# Patient Record
Sex: Male | Born: 1970 | Hispanic: Yes | Marital: Married | Smoking: Current every day smoker
Health system: Southern US, Community
[De-identification: ages and names within clinical notes are randomized; demographics above are authoritative.]

---

## 2008-12-05 ENCOUNTER — Emergency Department (HOSPITAL_COMMUNITY): Admission: EM | Admit: 2008-12-05 | Discharge: 2008-12-05 | Payer: Self-pay | Admitting: Family Medicine

## 2015-03-22 ENCOUNTER — Encounter (HOSPITAL_COMMUNITY): Payer: Self-pay | Admitting: Emergency Medicine

## 2015-03-22 ENCOUNTER — Emergency Department (HOSPITAL_COMMUNITY)
Admission: EM | Admit: 2015-03-22 | Discharge: 2015-03-23 | Disposition: A | Discharge status: Home / Self Care | Payer: BLUE CROSS/BLUE SHIELD | Attending: Emergency Medicine | Admitting: Emergency Medicine

## 2015-03-22 DIAGNOSIS — K6389 Other specified diseases of intestine: Secondary | ICD-10-CM | POA: Diagnosis not present

## 2015-03-22 DIAGNOSIS — R1032 Left lower quadrant pain: Secondary | ICD-10-CM | POA: Diagnosis present

## 2015-03-22 DIAGNOSIS — F17200 Nicotine dependence, unspecified, uncomplicated: Secondary | ICD-10-CM | POA: Insufficient documentation

## 2015-03-22 DIAGNOSIS — N50819 Testicular pain, unspecified: Secondary | ICD-10-CM | POA: Diagnosis not present

## 2015-03-22 DIAGNOSIS — K529 Noninfective gastroenteritis and colitis, unspecified: Secondary | ICD-10-CM

## 2015-03-22 LAB — COMPREHENSIVE METABOLIC PANEL
ALT: 30 U/L (ref 17–63)
ANION GAP: 10 (ref 5–15)
AST: 24 U/L (ref 15–41)
Albumin: 4 g/dL (ref 3.5–5.0)
Alkaline Phosphatase: 67 U/L (ref 38–126)
BILIRUBIN TOTAL: 0.5 mg/dL (ref 0.3–1.2)
BUN: 9 mg/dL (ref 6–20)
CO2: 24 mmol/L (ref 22–32)
Calcium: 9.4 mg/dL (ref 8.9–10.3)
Chloride: 104 mmol/L (ref 101–111)
Creatinine, Ser: 0.96 mg/dL (ref 0.61–1.24)
Glucose, Bld: 140 mg/dL — ABNORMAL HIGH (ref 65–99)
POTASSIUM: 3.6 mmol/L (ref 3.5–5.1)
Sodium: 138 mmol/L (ref 135–145)
TOTAL PROTEIN: 7 g/dL (ref 6.5–8.1)

## 2015-03-22 LAB — URINALYSIS, ROUTINE W REFLEX MICROSCOPIC
BILIRUBIN URINE: NEGATIVE
Glucose, UA: NEGATIVE mg/dL
HGB URINE DIPSTICK: NEGATIVE
KETONES UR: NEGATIVE mg/dL
Leukocytes, UA: NEGATIVE
NITRITE: NEGATIVE
PROTEIN: NEGATIVE mg/dL
SPECIFIC GRAVITY, URINE: 1.012 (ref 1.005–1.030)
pH: 5.5 (ref 5.0–8.0)

## 2015-03-22 LAB — CBC
HCT: 45.1 % (ref 39.0–52.0)
HEMOGLOBIN: 15.8 g/dL (ref 13.0–17.0)
MCH: 31.2 pg (ref 26.0–34.0)
MCHC: 35 g/dL (ref 30.0–36.0)
MCV: 89 fL (ref 78.0–100.0)
PLATELETS: 226 10*3/uL (ref 150–400)
RBC: 5.07 MIL/uL (ref 4.22–5.81)
RDW: 12.4 % (ref 11.5–15.5)
WBC: 12.3 10*3/uL — AB (ref 4.0–10.5)

## 2015-03-22 LAB — LIPASE, BLOOD: Lipase: 44 U/L (ref 11–51)

## 2015-03-22 NOTE — ED Notes (Signed)
Pt. reports LLQ pain onset 2 days ago , denies nausea or vomitting , no fever or diarrhea.

## 2015-03-23 ENCOUNTER — Encounter (HOSPITAL_COMMUNITY): Payer: Self-pay

## 2015-03-23 ENCOUNTER — Emergency Department (HOSPITAL_COMMUNITY): Payer: BLUE CROSS/BLUE SHIELD

## 2015-03-23 LAB — DIFFERENTIAL
Basophils Absolute: 0 K/uL (ref 0.0–0.1)
Basophils Relative: 0 %
Eosinophils Absolute: 0.3 K/uL (ref 0.0–0.7)
Eosinophils Relative: 3 %
Lymphocytes Relative: 26 %
Lymphs Abs: 3.2 K/uL (ref 0.7–4.0)
Monocytes Absolute: 1.2 K/uL — ABNORMAL HIGH (ref 0.1–1.0)
Monocytes Relative: 10 %
Neutro Abs: 7.4 K/uL (ref 1.7–7.7)
Neutrophils Relative %: 61 %

## 2015-03-23 MED ORDER — ONDANSETRON 4 MG PO TBDP
4.0000 mg | ORAL_TABLET | Freq: Three times a day (TID) | ORAL | Status: DC | PRN
Start: 2015-03-23 — End: 2015-11-22

## 2015-03-23 MED ORDER — SODIUM CHLORIDE 0.9 % IV BOLUS (SEPSIS)
1000.0000 mL | Freq: Once | INTRAVENOUS | Status: AC
  Administered 2015-03-23: 1000 mL via INTRAVENOUS

## 2015-03-23 MED ORDER — HYDROCODONE-ACETAMINOPHEN 5-325 MG PO TABS: 1.0000 | ORAL_TABLET | ORAL | Status: DC | PRN

## 2015-03-23 MED ORDER — KETOROLAC TROMETHAMINE 30 MG/ML IJ SOLN
30.0000 mg | Freq: Once | INTRAMUSCULAR | Status: AC
  Administered 2015-03-23: 30 mg via INTRAVENOUS
  Filled 2015-03-23: qty 1

## 2015-03-23 MED ORDER — ONDANSETRON HCL 4 MG/2ML IJ SOLN
4.0000 mg | Freq: Once | INTRAMUSCULAR | Status: AC
  Administered 2015-03-23: 4 mg via INTRAVENOUS
  Filled 2015-03-23: qty 2

## 2015-03-23 MED ORDER — MORPHINE SULFATE (PF) 4 MG/ML IV SOLN
4.0000 mg | Freq: Once | INTRAVENOUS | Status: AC
  Administered 2015-03-23: 4 mg via INTRAVENOUS
  Filled 2015-03-23: qty 1

## 2015-03-23 MED ORDER — IOHEXOL 300 MG/ML  SOLN
100.0000 mL | Freq: Once | INTRAMUSCULAR | Status: AC | PRN
  Administered 2015-03-23: 100 mL via INTRAVENOUS

## 2015-03-23 MED ORDER — IBUPROFEN 800 MG PO TABS: 800.0000 mg | ORAL_TABLET | Freq: Three times a day (TID) | ORAL | Status: DC | PRN

## 2015-03-23 NOTE — ED Provider Notes (Signed)
By signing my name below, I, Doreatha Martin, attest that this documentation has been prepared under the direction and in the presence of Kristen N Ward, DO. Electronically Signed: Doreatha Martin, ED Scribe. 03/23/2015. 12:55 AM.   TIME SEEN: 12:48 AM   CHIEF COMPLAINT:  Chief Complaint  Patient presents with  . Abdominal Pain     HPI:  HPI Comments: Brandon Salas is a 44 y.o. male with no significant past medical history who presents to the Emergency Department complaining of moderate sharp suprapubic and LLQ abdominal pain onset 2 days ago with associated diarrhea (3-4 episodes today). He notes that his pain also radiates to the testicles. Pt states no worsening or alleviating factors of his pain. No h/o similar symptoms, abdominal surgery, renal calculi, diverticulitis, colonoscopy. No recent travel outside the country or sick contacts. Pt is not currently followed by a PCP. NKDA. He denies fever, chills, nausea, vomiting, hematochezia, melena, dysuria, hematuria, penile discharge, testicular swelling. States he has had several episodes of diarrhea.  ROS: See HPI Constitutional: no fever, chills Eyes: no drainage  ENT: no runny nose   Cardiovascular:  no chest pain  Resp: no SOB  GI: no vomiting, nausea, hematochezia, melena. Positive for abdominal pain, diarrhea.  GU: no dysuria, hematuria, penile discharge, testicular swelling. Positive for testicular pain Integumentary: no rash  Allergy: no hives  Musculoskeletal: no leg swelling  Neurological: no slurred speech ROS otherwise negative  PAST MEDICAL HISTORY/PAST SURGICAL HISTORY:  History reviewed. No pertinent past medical history.  MEDICATIONS:  Prior to Admission medications   Not on File    ALLERGIES:  No Known Allergies  SOCIAL HISTORY:  Social History  Substance Use Topics  . Smoking status: Current Every Day Smoker  . Smokeless tobacco: Not on file  . Alcohol Use: Yes    FAMILY HISTORY: No family history  on file.  EXAM: BP 103/66 mmHg  Pulse 109  Temp(Src) 97.9 F (36.6 C) (Oral)  Resp 18  SpO2 96% CONSTITUTIONAL: Alert and oriented and responds appropriately to questions. Well-appearing; well-nourished, afebrile, nontoxic HEAD: Normocephalic EYES: Conjunctivae clear, PERRL ENT: normal nose; no rhinorrhea; moist mucous membranes; pharynx without lesions noted NECK: Supple, no meningismus, no LAD  CARD: RRR; S1 and S2 appreciated; no murmurs, no clicks, no rubs, no gallops RESP: Normal chest excursion without splinting or tachypnea; breath sounds clear and equal bilaterally; no wheezes, no rhonchi, no rales, no hypoxia or respiratory distress, speaking full sentences ABD/GI: Tender in suprapubic region and LLQ region with voluntary guarding. Normal bowel sounds; non-distended; soft, no rebound, no peritoneal signs. No tenderness at McBurney's point, negative Murphy sign. GU: Chaperone present throughout entire exam. GU:  Normal external genitalia, circumcised male, normal penile shaft, no blood or discharge at the urethral meatus, no testicular masses or tenderness on exam, no scrotal masses or swelling, no hernias appreciated, 2+ femoral pulses bilaterally; no perineal erythema, warmth, subcutaneous air or crepitus; no high riding testicle, normal bilateral cremasteric reflex BACK:  The back appears normal and is non-tender to palpation, there is no CVA tenderness EXT: Normal ROM in all joints; non-tender to palpation; no edema; normal capillary refill; no cyanosis, no calf tenderness or swelling    SKIN: Normal color for age and race; warm NEURO: Moves all extremities equally, sensation to light touch intact diffusely, cranial nerves II through XII intact PSYCH: The patient's mood and manner are appropriate. Grooming and personal hygiene are appropriate.  MEDICAL DECISION MAKING: Patient here with left lower quadrant pain. Differential  diagnosis includes diverticulitis, colitis, bowel  obstruction. Less likely kidney stone or palate nephritis given normal urinalysis. Labs ordered in triage show mild leukocytosis without left shift. LFTs, creatinine and lipase unremarkable. No tenderness in the right lower quadrant at McBurney's point. Negative Murphy sign. Normal genital exam. Will give IV fluids, pain and nausea medicine. We'll obtain a CT of his abdomen and pelvis for further evaluation.  ED PROGRESS: Patient reports his pain is improved. CT scan is consistent with epiploic appendage I does. No diverticulitis or appendicitis seen. No abscess. No bowel obstruction or free air. He does have diverticuli. Have recommended anti-inflammatories. Will also discharge with prescription for Vicodin to take as needed for pain. Have provided him with outpatient primary care follow-up. Have discussed return precautions. I do not feel he needs antibiotics. Patient and significant other at bedside verbalize understanding and are comfortable with this plan.   I personally performed the services described in this documentation, which was scribed in my presence. The recorded information has been reviewed and is accurate.   Layla MawKristen N Ward, DO 03/23/15 [phone]

## 2015-03-23 NOTE — Discharge Instructions (Signed)
You were seen in the emergency department for abdominal pain and diarrhea. You were found to have epiploic appendagitis which is inflammation of a small outpouching of fat along your intestine. This treated with anti-inflammatories. We recommend that if you have high fever, worsening pain, vomiting and cannot tolerate liquids that you return to the emergency department. This is not a condition that normally needs surgery or antibiotics. It normally resolves between 3-14 days.

## 2015-05-10 ENCOUNTER — Ambulatory Visit (INDEPENDENT_AMBULATORY_CARE_PROVIDER_SITE_OTHER): Payer: BLUE CROSS/BLUE SHIELD | Admitting: Family Medicine

## 2015-05-10 ENCOUNTER — Encounter: Payer: Self-pay | Admitting: Family Medicine

## 2015-05-10 DIAGNOSIS — R1032 Left lower quadrant pain: Secondary | ICD-10-CM

## 2015-05-10 DIAGNOSIS — R109 Unspecified abdominal pain: Secondary | ICD-10-CM | POA: Insufficient documentation

## 2015-05-10 DIAGNOSIS — Z23 Encounter for immunization: Secondary | ICD-10-CM | POA: Diagnosis not present

## 2015-05-10 DIAGNOSIS — Z0000 Encounter for general adult medical examination without abnormal findings: Secondary | ICD-10-CM | POA: Diagnosis not present

## 2015-05-10 LAB — CBC WITH DIFFERENTIAL/PLATELET
BASOS ABS: 0.1 10*3/uL (ref 0.0–0.1)
Basophils Relative: 1 % (ref 0–1)
EOS ABS: 0.3 10*3/uL (ref 0.0–0.7)
EOS PCT: 4 % (ref 0–5)
HEMATOCRIT: 48 % (ref 39.0–52.0)
Hemoglobin: 16.4 g/dL (ref 13.0–17.0)
LYMPHS ABS: 2.6 10*3/uL (ref 0.7–4.0)
LYMPHS PCT: 34 % (ref 12–46)
MCH: 30.1 pg (ref 26.0–34.0)
MCHC: 34.2 g/dL (ref 30.0–36.0)
MCV: 88.2 fL (ref 78.0–100.0)
MONO ABS: 0.8 10*3/uL (ref 0.1–1.0)
MONOS PCT: 10 % (ref 3–12)
MPV: 11.7 fL (ref 8.6–12.4)
Neutro Abs: 3.9 10*3/uL (ref 1.7–7.7)
Neutrophils Relative %: 51 % (ref 43–77)
PLATELETS: 210 10*3/uL (ref 150–400)
RBC: 5.44 MIL/uL (ref 4.22–5.81)
RDW: 12.7 % (ref 11.5–15.5)
WBC: 7.7 10*3/uL (ref 4.0–10.5)

## 2015-05-10 LAB — LIPID PANEL
Cholesterol: 237 mg/dL — ABNORMAL HIGH (ref 125–200)
HDL: 35 mg/dL — ABNORMAL LOW (ref >=40)
LDL CALC: 127 mg/dL (ref <130)
Total CHOL/HDL Ratio: 6.8 Ratio — ABNORMAL HIGH (ref <=5.0)
Triglycerides: 373 mg/dL — ABNORMAL HIGH (ref <150)
VLDL: 75 mg/dL — ABNORMAL HIGH (ref <30)

## 2015-05-10 NOTE — Progress Notes (Signed)
Patient ID: Brandon Salas, male   DOB: April 09, 1970, 45 y.o.   MRN: [id number]   Brandon Salas, is a 45 y.o. male  [id number]  [id number]  DOB - 09-16-1970  CC:  Chief Complaint  Patient presents with  . Establish Care       HPI: Brandon Salas is a 45 y.o. male here to establish care. He was recently seen in ED for LLQ pain. No specific reason for the pain was determined. He reports that he continues to have intermittent LLQ pain. I is aggravated by eating heavy meals. He reports with the help of an interpreter that is may be related to [facility] but he is unsure. He reports as being gassy pain. He denies other specific health problems. He is on no regular medications. He is in need of a flu shot and a tetanus shot and will receive those today. He reports eating a regular diet and does not exercise regularly.   No Known Allergies No past medical history on file. Current Outpatient Prescriptions on File Prior to Visit  Medication Sig Dispense Refill  . HYDROcodone-acetaminophen (NORCO/VICODIN) 5-325 MG tablet Take 1 tablet by mouth every 4 (four) hours as needed. (Patient not taking: Reported on 05/10/2015) 15 tablet 0  . ibuprofen (ADVIL,MOTRIN) 800 MG tablet Take 1 tablet (800 mg total) by mouth every 8 (eight) hours as needed for mild pain. (Patient not taking: Reported on 05/10/2015) 30 tablet 0  . ondansetron (ZOFRAN ODT) 4 MG disintegrating tablet Take 1 tablet (4 mg total) by mouth every 8 (eight) hours as needed for nausea or vomiting. (Patient not taking: Reported on 05/10/2015) 20 tablet 0   No current facility-administered medications on file prior to visit.   No family history on file. Social History   Social History  . Marital Status: Single    Spouse Name: N/A  . Number of Children: N/A  . Years of Education: N/A   Occupational History  . Not on file.   Social History Main Topics  . Smoking status: Current Every Day Smoker  . Smokeless tobacco:  Not on file  . Alcohol Use: No  . Drug Use: No  . Sexual Activity: Not on file   Other Topics Concern  . Not on file   Social History Narrative    Review of Systems: Constitutional: Negative for fever, chills, appetite change, weight loss,  Fatigue. Skin: Negative for rashes or lesions of concern. Reports intermittent rash on arms HENT: Negative for ear pain, ear discharge.nose bleeds Eyes: Negative for pain, discharge, redness, itching and visual disturbance. Neck: Negative for pain, stiffness Respiratory: Negative for cough, shortness of breath,   Cardiovascular: Negative for chest pain, palpitations and leg swelling. Gastrointestinal:Positive  for abdominal pain. Negative for nausea, vomiting, diarrhea. Positive for constipations Genitourinary: Negative for dysuria, urgency, frequency, hematuria,  Musculoskeletal: Positive for low back pain and foot pain. Otherwise negative joint pain, joint  swelling, and gait problem.Negative for weakness. Neurological: Negative for tremors, seizures, syncope,   light-headedness, numbness and headaches. Positive for occassional dizziness. Hematological: Negative for easy bruising or bleeding Psychiatric/Behavioral: Negative for depression, anxiety, decreased concentration, confusion   Objective:   Filed Vitals:   05/10/15 0913  BP: 128/89  Pulse: 83  Temp: 98.4 F (36.9 C)  Resp: 20    Physical Exam: Constitutional: Patient appears well-developed and well-nourished. No distress. HENT: Normocephalic, atraumatic, External right and left ear normal. Oropharynx is clear and moist.  Eyes: Conjunctivae and EOM are normal. PERRLA, no scleral icterus.  Neck: Normal ROM. Neck supple. No lymphadenopathy, No thyromegaly. CVS: RRR, S1/S2 +, no murmurs, no gallops, no rubs Pulmonary: Effort and breath sounds normal, no stridor, rhonchi, wheezes, rales.  Abdominal: Soft. Normoactive BS,, no distension, rebound or guarding. Mild tenderness in  LLQ Musculoskeletal: Normal range of motion. No edema and no tenderness.  Neuro: Alert.Normal muscle tone coordination. Non-focal Skin: Skin is warm and dry. No rash noted. Not diaphoretic. No erythema. No pallor. Psychiatric: Normal mood and affect. Behavior, judgment, thought content normal.  Lab Results  Component Value Date   WBC 12.3* 03/22/2015   HGB 15.8 03/22/2015   HCT 45.1 03/22/2015   MCV 89.0 03/22/2015   PLT 226 03/22/2015   Lab Results  Component Value Date   CREATININE 0.96 03/22/2015   BUN 9 03/22/2015   NA 138 03/22/2015   K 3.6 03/22/2015   CL 104 03/22/2015   CO2 24 03/22/2015    No results found for: HGBA1C Lipid Panel  No results found for: CHOL, TRIG, HDL, CHOLHDL, VLDL, LDLCALC     Assessment and plan:   1. Need for prophylactic vaccination and inoculation against influenza  - Flu Vaccine QUAD 36+ mos PF IM (Fluarix & Fluzone Quad PF)  2. Need for prophylactic vaccination with combined diphtheria-tetanus-pertussis (DTP) vaccine  - Tdap vaccine greater than or equal to 7yo IM  3. Abdominal pain, left lower quadrant  - CBC w/Diff -Have recommended he keep a pain, food and BM diary to see if he can determine causitive factor and modify.  4. Health care maintenance  - Lipid panel   Return in about 6 months (around 11/07/2015).  The patient was given clear instructions to go to ER or return to medical center if symptoms don't improve, worsen or new problems develop. The patient verbalized understanding.    Henrietta Hoover FNP  05/10/2015, 10:43 AM

## 2015-05-10 NOTE — Patient Instructions (Signed)
Keep a diary of your abdominal pain, when you eat, what you eat and your bowel movements.  Eat smaller meals.

## 2015-11-08 ENCOUNTER — Ambulatory Visit: Payer: BLUE CROSS/BLUE SHIELD | Admitting: Family Medicine

## 2015-11-22 ENCOUNTER — Ambulatory Visit (INDEPENDENT_AMBULATORY_CARE_PROVIDER_SITE_OTHER): Payer: BLUE CROSS/BLUE SHIELD | Admitting: Family Medicine

## 2015-11-22 ENCOUNTER — Encounter: Payer: Self-pay | Admitting: Family Medicine

## 2015-11-22 VITALS — BP 113/85 | HR 83 | Temp 98.2°F | Resp 16 | Ht 62.0 in | Wt 168.0 lb

## 2015-11-22 DIAGNOSIS — Z114 Encounter for screening for human immunodeficiency virus [HIV]: Secondary | ICD-10-CM

## 2015-11-22 DIAGNOSIS — Z13220 Encounter for screening for lipoid disorders: Secondary | ICD-10-CM

## 2015-11-22 DIAGNOSIS — Z131 Encounter for screening for diabetes mellitus: Secondary | ICD-10-CM

## 2015-11-22 DIAGNOSIS — Z0000 Encounter for general adult medical examination without abnormal findings: Secondary | ICD-10-CM

## 2015-11-22 DIAGNOSIS — R109 Unspecified abdominal pain: Secondary | ICD-10-CM

## 2015-11-22 DIAGNOSIS — R1032 Left lower quadrant pain: Secondary | ICD-10-CM

## 2015-11-22 LAB — LIPID PANEL
CHOLESTEROL: 209 mg/dL — AB (ref 125–200)
HDL: 33 mg/dL — ABNORMAL LOW (ref >=40)
LDL CALC: 119 mg/dL (ref <130)
TRIGLYCERIDES: 285 mg/dL — AB (ref <150)
Total CHOL/HDL Ratio: 6.3 Ratio — ABNORMAL HIGH (ref <=5.0)
VLDL: 57 mg/dL — AB (ref <30)

## 2015-11-22 LAB — CBC WITH DIFFERENTIAL/PLATELET
BASOS ABS: 82 {cells}/uL (ref 0–200)
Basophils Relative: 1 %
EOS PCT: 3 %
Eosinophils Absolute: 246 cells/uL (ref 15–500)
HCT: 47.8 % (ref 38.5–50.0)
Hemoglobin: 16.4 g/dL (ref 13.2–17.1)
LYMPHS PCT: 37 %
Lymphs Abs: 3034 cells/uL (ref 850–3900)
MCH: 30.3 pg (ref 27.0–33.0)
MCHC: 34.3 g/dL (ref 32.0–36.0)
MCV: 88.4 fL (ref 80.0–100.0)
MPV: 11.9 fL (ref 7.5–12.5)
Monocytes Absolute: 738 cells/uL (ref 200–950)
Monocytes Relative: 9 %
NEUTROS PCT: 50 %
Neutro Abs: 4100 cells/uL (ref 1500–7800)
Platelets: 198 10*3/uL (ref 140–400)
RBC: 5.41 MIL/uL (ref 4.20–5.80)
RDW: 13.5 % (ref 11.0–15.0)
WBC: 8.2 10*3/uL (ref 3.8–10.8)

## 2015-11-22 LAB — COMPLETE METABOLIC PANEL WITH GFR
ALBUMIN: 4.3 g/dL (ref 3.6–5.1)
ALK PHOS: 59 U/L (ref 40–115)
ALT: 25 U/L (ref 9–46)
AST: 17 U/L (ref 10–40)
BILIRUBIN TOTAL: 0.3 mg/dL (ref 0.2–1.2)
BUN: 8 mg/dL (ref 7–25)
CALCIUM: 9.5 mg/dL (ref 8.6–10.3)
CO2: 20 mmol/L (ref 20–31)
CREATININE: 0.87 mg/dL (ref 0.60–1.35)
Chloride: 106 mmol/L (ref 98–110)
GFR, Est African American: >89 mL/min (ref >=60)
GFR, Est Non African American: >89 mL/min (ref >=60)
GLUCOSE: 86 mg/dL (ref 65–99)
Potassium: 4.3 mmol/L (ref 3.5–5.3)
Sodium: 137 mmol/L (ref 135–146)
TOTAL PROTEIN: 7.1 g/dL (ref 6.1–8.1)

## 2015-11-22 LAB — HEMOGLOBIN A1C
HEMOGLOBIN A1C: 5.4 % (ref <5.7)
MEAN PLASMA GLUCOSE: 108 mg/dL

## 2015-11-22 NOTE — Patient Instructions (Signed)
I am ordering a CT scan of abdomen to see if there is any change since December.

## 2015-11-22 NOTE — Progress Notes (Signed)
Brandon KraftJulian Salas, is a 45 y.o. male  EAV:[id number]:[id number]  NWG:[id number]  DOB - Sep 06, 1970  CC:  Chief Complaint  Patient presents with  . Abdominal Pain       HPI: Brandon Salas is a 45 y.o. male here for routine scheduled followed. He was seen 6 months ago for follow up from ED for abd pain.  He reports he continues to have some pain intermittently. He is unable to describe the pain. He reports it last 5-10 minutes and does not disturb his activities. He reports being 1-2 out of 10.He cannot identify any causative, aggravating or alleviating factors. He denies any associated symptoms. He reports it happens about every day. A CT scan in December showed some fatty liver infiltrates and a condition called epiploic appendagitis. It is unclear whether this was a cause of his pain at that time.He has no other complaints. He declines flu shot. He is on no current medications  No Known Allergies History reviewed. No pertinent past medical history. No current outpatient prescriptions on file prior to visit.   No current facility-administered medications on file prior to visit.    History reviewed. No pertinent family history. Social History   Social History  . Marital status: Single    Spouse name: N/A  . Number of children: N/A  . Years of education: N/A   Occupational History  . Not on file.   Social History Main Topics  . Smoking status: Current Every Day Smoker    Packs/day: 1.00  . Smokeless tobacco: Never Used  . Alcohol use Yes     Comment: occ  . Drug use: No  . Sexual activity: Not on file   Other Topics Concern  . Not on file   Social History Narrative  . No narrative on file    Review of Systems: Constitutional: Negative for fever, chills, appetite change, weight loss,  Fatigue. Skin: Negative for rashes or lesions of concern. HENT: Negative for ear pain, ear discharge.nose bleeds Eyes: Negative for pain, discharge, redness, itching and visual  disturbance. Neck: Negative for pain, stiffness Respiratory: Negative for cough, shortness of breath,   Cardiovascular: Negative for chest pain, palpitations and leg swelling. Gastrointestinal: Negative for nausea, vomiting, diarrhea, constipations. Positive for pain Genitourinary: Negative for dysuria, urgency, frequency, hematuria,  Musculoskeletal: Negative for back pain, joint pain, joint  swelling, and gait problem.Negative for weakness. Neurological: Negative for dizziness, tremors, seizures, syncope,   light-headedness, numbness and headaches.  Hematological: Negative for easy bruising or bleeding Psychiatric/Behavioral: Negative for depression, anxiety, decreased concentration, confusion   Objective:   Vitals:   11/22/15 0811  BP: 113/85  Pulse: 83  Resp: 16  Temp: 98.2 F (36.8 C)    Physical Exam: Constitutional: Patient appears well-developed and well-nourished. No distress. HENT: Normocephalic, atraumatic, External right and left ear normal. Oropharynx is clear and moist.  Eyes: Conjunctivae and EOM are normal. PERRLA, no scleral icterus. Neck: Normal ROM. Neck supple. No lymphadenopathy, No thyromegaly. CVS: RRR, S1/S2 +, no murmurs, no gallops, no rubs Pulmonary: Effort and breath sounds normal, no stridor, rhonchi, wheezes, rales.  Abdominal: Soft. Normoactive BS,, no distension, rebound or guarding. He reports tenderness wherever I press.  Musculoskeletal: Normal range of motion. No edema and no tenderness.  Neuro: Alert.Normal muscle tone coordination. Non-focal Skin: Skin is warm and dry. No rash noted. Not diaphoretic. No erythema. No pallor. Psychiatric: Normal mood and affect. Behavior, judgment, thought content normal.  Lab Results  Component Value Date   WBC  7.7 05/10/2015   HGB 16.4 05/10/2015   HCT 48.0 05/10/2015   MCV 88.2 05/10/2015   PLT 210 05/10/2015   Lab Results  Component Value Date   CREATININE 0.96 03/22/2015   BUN 9 03/22/2015   NA  138 03/22/2015   K 3.6 03/22/2015   CL 104 03/22/2015   CO2 24 03/22/2015    No results found for: HGBA1C Lipid Panel     Component Value Date/Time   CHOL 237 (H) 05/10/2015 1009   TRIG 373 (H) 05/10/2015 1009   HDL 35 (L) 05/10/2015 1009   CHOLHDL 6.8 (H) 05/10/2015 1009   VLDL 75 (H) 05/10/2015 1009   LDLCALC 127 05/10/2015 1009       Assessment and plan:   1. Screening for HIV (human immunodeficiency virus)  - HIV antibody (with reflex)  2. Abdominal pain, unspecified abdominal location  - COMPLETE METABOLIC PANEL WITH GFR - CBC with Differential - CT Abdomen Pelvis W Contrast; Future  3. Screening cholesterol level  - Lipid panel  4. Screening for diabetes mellitus  - Hemoglobin A1c   Return In one year and prn.  The patient was given clear instructions to go to ER or return to medical center if symptoms don't improve, worsen or new problems develop. The patient verbalized understanding.    Henrietta HooverLinda C Verginia Toohey FNP  11/22/2015, 8:49 AM

## 2015-11-23 ENCOUNTER — Telehealth: Payer: Self-pay

## 2015-11-23 LAB — HIV ANTIBODY (ROUTINE TESTING W REFLEX): HIV 1&2 Ab, 4th Generation: NONREACTIVE

## 2015-11-23 NOTE — Telephone Encounter (Signed)
-----   Message from Henrietta HooverLinda C Bernhardt, NP sent at 11/23/2015  7:55 AM EDT ----- HIV neg. A1C 5.4. Cmet wnl. Cholesterol and triglycerides elevated (non-fasting). For now would eat low fat, low cholesterol diet and try to increase exercise. Recheck in 6 months.CBC OK.

## 2015-11-23 NOTE — Telephone Encounter (Signed)
Called to advised of Cholesterol and the need to cancel  the CT scheduled due to insurance would not approve. NO answer. Left message for someone to call back asap. Thanks!

## 2015-11-27 ENCOUNTER — Ambulatory Visit (HOSPITAL_COMMUNITY): Payer: BLUE CROSS/BLUE SHIELD

## 2016-06-01 ENCOUNTER — Emergency Department (HOSPITAL_COMMUNITY)
Admission: EM | Admit: 2016-06-01 | Discharge: 2016-06-02 | Disposition: A | Discharge status: Home / Self Care | Payer: Self-pay | Attending: Emergency Medicine | Admitting: Emergency Medicine

## 2016-06-01 ENCOUNTER — Encounter (HOSPITAL_COMMUNITY): Payer: Self-pay | Admitting: Emergency Medicine

## 2016-06-01 ENCOUNTER — Emergency Department (HOSPITAL_COMMUNITY): Payer: Self-pay

## 2016-06-01 DIAGNOSIS — K4090 Unilateral inguinal hernia, without obstruction or gangrene, not specified as recurrent: Secondary | ICD-10-CM | POA: Insufficient documentation

## 2016-06-01 DIAGNOSIS — R1032 Left lower quadrant pain: Secondary | ICD-10-CM

## 2016-06-01 DIAGNOSIS — F17200 Nicotine dependence, unspecified, uncomplicated: Secondary | ICD-10-CM | POA: Insufficient documentation

## 2016-06-01 DIAGNOSIS — R1012 Left upper quadrant pain: Secondary | ICD-10-CM

## 2016-06-01 LAB — COMPREHENSIVE METABOLIC PANEL
ALK PHOS: 63 U/L (ref 38–126)
ALT: 33 U/L (ref 17–63)
AST: 29 U/L (ref 15–41)
Albumin: 4 g/dL (ref 3.5–5.0)
Anion gap: 11 (ref 5–15)
BILIRUBIN TOTAL: 0.5 mg/dL (ref 0.3–1.2)
BUN: 5 mg/dL — AB (ref 6–20)
CO2: 22 mmol/L (ref 22–32)
CREATININE: 1.04 mg/dL (ref 0.61–1.24)
Calcium: 9 mg/dL (ref 8.9–10.3)
Chloride: 105 mmol/L (ref 101–111)
GFR calc Af Amer: >60 mL/min (ref >60)
Glucose, Bld: 95 mg/dL (ref 65–99)
Potassium: 3.6 mmol/L (ref 3.5–5.1)
Sodium: 138 mmol/L (ref 135–145)
TOTAL PROTEIN: 7 g/dL (ref 6.5–8.1)

## 2016-06-01 LAB — CBC
HCT: 45.2 % (ref 39.0–52.0)
Hemoglobin: 15.8 g/dL (ref 13.0–17.0)
MCH: 30.5 pg (ref 26.0–34.0)
MCHC: 35 g/dL (ref 30.0–36.0)
MCV: 87.3 fL (ref 78.0–100.0)
PLATELETS: 186 10*3/uL (ref 150–400)
RBC: 5.18 MIL/uL (ref 4.22–5.81)
RDW: 13 % (ref 11.5–15.5)
WBC: 8.8 10*3/uL (ref 4.0–10.5)

## 2016-06-01 LAB — LIPASE, BLOOD: Lipase: 22 U/L (ref 11–51)

## 2016-06-01 LAB — URINALYSIS, ROUTINE W REFLEX MICROSCOPIC
GLUCOSE, UA: NEGATIVE mg/dL
Hgb urine dipstick: NEGATIVE
KETONES UR: NEGATIVE mg/dL
LEUKOCYTES UA: NEGATIVE
NITRITE: NEGATIVE
PROTEIN: NEGATIVE mg/dL
Specific Gravity, Urine: 1.028 (ref 1.005–1.030)
pH: 5 (ref 5.0–8.0)

## 2016-06-01 MED ORDER — IOPAMIDOL (ISOVUE-300) INJECTION 61%
INTRAVENOUS | Status: AC
  Administered 2016-06-01: 100 mL
  Filled 2016-06-01: qty 100

## 2016-06-01 MED ORDER — KETOROLAC TROMETHAMINE 15 MG/ML IJ SOLN
15.0000 mg | Freq: Once | INTRAMUSCULAR | Status: AC
  Administered 2016-06-01: 15 mg via INTRAVENOUS
  Filled 2016-06-01: qty 1

## 2016-06-01 NOTE — Discharge Instructions (Addendum)
Mr. Curly Rimlcantersanchez,  Please follow up with your primary care doctor following discharge.

## 2016-06-01 NOTE — ED Provider Notes (Signed)
[facility]-EMERGENCY DEPT Provider Note   CSN: [id number] Arrival date & time: 06/01/16  2136     History   Chief Complaint Chief Complaint  Patient presents with  . Abdominal Pain    LLQ    HPI Brandon Salas is a 46 y.o. male. With history of tobacco abuse that presents to the ED for 1 year history of left sided abdominal pain that has recently gotten worse in the last 2 weeks.  He reports coming into the ED a year ago with abdominal pain.  Patient states that the pain is intermittent and described as throbbing with pressure.  The pain is localized to the left lower quadrant.  He denies fever/chills, nausea/vomiting, weight loss, constipation, diarrhea, blood in stool.       HPI  History reviewed. No pertinent past medical history.  Patient Active Problem List   Diagnosis Date Noted  . Abdominal pain 05/10/2015    History reviewed. No pertinent surgical history.     Home Medications    Prior to Admission medications   Medication Sig Start Date End Date Taking? Authorizing Provider  naproxen (NAPROSYN) 500 MG tablet Take 1 tablet (500 mg total) by mouth 2 (two) times daily. 06/02/16   Shon Baton, MD    Family History History reviewed. No pertinent family history.  Social History Social History  Substance Use Topics  . Smoking status: Current Every Day Smoker    Packs/day: 1.00  . Smokeless tobacco: Never Used  . Alcohol use Yes     Comment: occ     Allergies   Patient has no known allergies.   Review of Systems Review of Systems  Constitutional: Negative for fatigue and fever.  Respiratory: Negative for shortness of breath.   Cardiovascular: Negative for leg swelling.  Gastrointestinal: Positive for abdominal pain. Negative for constipation, diarrhea, nausea and vomiting.  Genitourinary: Negative for dysuria.  Musculoskeletal: Negative for back pain and myalgias.  Neurological: Negative for dizziness and syncope.     Physical  Exam Updated Vital Signs BP 111/81   Pulse 77   Temp 97.7 F (36.5 C) (Oral)   Resp 20   Ht 5\' 5"  (1.651 m)   Wt 74.8 kg   SpO2 97%   BMI 27.46 kg/m   Physical Exam  Constitutional: He appears well-developed and well-nourished.  Cardiovascular: Normal rate, regular rhythm and normal heart sounds.  Exam reveals no gallop and no friction rub.   No murmur heard. Pulmonary/Chest: Effort normal and breath sounds normal. No respiratory distress. He has no wheezes. He has no rales.  Abdominal: Soft. Bowel sounds are normal. He exhibits no mass. There is no rebound and no guarding.  Tenderness in left upper and lower quadrant  Musculoskeletal: He exhibits no edema.  Skin: Skin is warm and dry.     ED Treatments / Results  Labs (all labs ordered are listed, but only abnormal results are displayed) Labs Reviewed  COMPREHENSIVE METABOLIC PANEL - Abnormal; Notable for the following:       Result Value   BUN 5 (*)    All other components within normal limits  URINALYSIS, ROUTINE W REFLEX MICROSCOPIC - Abnormal; Notable for the following:    Color, Urine AMBER (*)    Bilirubin Urine SMALL (*)    All other components within normal limits  LIPASE, BLOOD  CBC    EKG  EKG Interpretation None       Radiology Ct Abdomen Pelvis W Contrast  Result Date: 06/01/2016 CLINICAL  DATA:  Left-sided abdominal pain. EXAM: CT ABDOMEN AND PELVIS WITH CONTRAST TECHNIQUE: Multidetector CT imaging of the abdomen and pelvis was performed using the standard protocol following bolus administration of intravenous contrast. CONTRAST:  100mL ISOVUE-300 IOPAMIDOL (ISOVUE-300) INJECTION 61% COMPARISON:  03/23/2015 FINDINGS: Lower chest: Lung bases are within normal. Hepatobiliary: Within normal. Pancreas: Within normal. Spleen: Within normal. Adrenals/Urinary Tract: Adrenal glands are normal. Kidneys are normal in size without hydronephrosis or nephrolithiasis. Ureters and bladder are normal. Stomach/Bowel:  The stomach and small bowel are normal. Appendix is normal. Colon is unremarkable. Vascular/Lymphatic: Vascular structures are within normal. No adenopathy. Reproductive: Within normal. Other: Small left inguinal hernia containing only peritoneal fat. No free fluid or focal inflammatory change. Near complete resolution of the previously noted acute epiploic appendagitis left lower quadrant. Musculoskeletal: Within normal. IMPRESSION: No acute findings in the abdomen/pelvis. Small left inguinal hernia containing only peritoneal fat. Electronically Signed   By: Elberta Fortisaniel  Boyle M.D.   On: 06/01/2016 23:34    Procedures Procedures (including critical care time)  Medications Ordered in ED Medications  ketorolac (TORADOL) 15 MG/ML injection 15 mg (15 mg Intravenous Given 06/01/16 2307)  iopamidol (ISOVUE-300) 61 % injection (100 mLs  Contrast Given 06/01/16 2319)     Initial Impression / Assessment and Plan / ED Course  I have reviewed the triage vital signs and the nursing notes.  Pertinent labs & imaging results that were available during my care of the patient were reviewed by me and considered in my medical decision making (see chart for details).   Patient presents to the ED for left upper and lower quadrant pain that has been intermittent for the past year but worse in the past month.  He had a CT abdomen 03/23/15 consistent with epiploic appendage.  Today's CT abdomen was pending at shift change and patient was signed out to incoming provider.   Final Clinical Impressions(s) / ED Diagnoses   Final diagnoses:  Left upper quadrant pain  Left lower quadrant pain  Left inguinal hernia    New Prescriptions Discharge Medication List as of 06/02/2016  3:44 AM    START taking these medications   Details  naproxen (NAPROSYN) 500 MG tablet Take 1 tablet (500 mg total) by mouth 2 (two) times daily., Starting Mon 06/02/2016, Print         [address]Edmar Blankenburg Ratliff MetcalfeHoffman, DO 06/02/16 [id number]    Blane OharaJoshua  Zavitz, MD 06/06/16 [phone]

## 2016-06-01 NOTE — ED Triage Notes (Signed)
Pt presents to ER from home with LLQ abd pain all day today; pt denies n/v/d, denies fever, denies constipation

## 2016-06-02 MED ORDER — NAPROXEN 500 MG PO TABS: 500.0000 mg | ORAL_TABLET | Freq: Two times a day (BID) | ORAL | 0 refills | Status: DC

## 2016-06-02 NOTE — ED Provider Notes (Signed)
Patient signed out pending CT scan. CT scan is largely reassuring. He does have a fat-containing left inguinal hernia. On exam, no palpable mass noted, he does have some tenderness to palpation along the inguinal canal but no overlying skin changes or reducible mass. Discussed this with the patient and his family. We'll get surgery follow-up as pain may be related to hernia. Does not currently appear incarcerated or strangulated.  After history, exam, and medical workup I feel the patient has been appropriately medically screened and is safe for discharge home. Pertinent diagnoses were discussed with the patient. Patient was given return precautions.    Shon Batonourtney F Elizabet Schweppe, MD 06/02/16 [phone]

## 2017-05-04 ENCOUNTER — Ambulatory Visit: Payer: BLUE CROSS/BLUE SHIELD | Attending: Nurse Practitioner | Admitting: Nurse Practitioner

## 2017-05-04 ENCOUNTER — Encounter: Payer: Self-pay | Admitting: Nurse Practitioner

## 2017-05-04 VITALS — BP 125/82 | HR 108 | Temp 98.0°F | Ht 63.0 in | Wt 171.0 lb

## 2017-05-04 DIAGNOSIS — R1032 Left lower quadrant pain: Secondary | ICD-10-CM | POA: Diagnosis present

## 2017-05-04 DIAGNOSIS — R42 Dizziness and giddiness: Secondary | ICD-10-CM | POA: Insufficient documentation

## 2017-05-04 DIAGNOSIS — F17200 Nicotine dependence, unspecified, uncomplicated: Secondary | ICD-10-CM

## 2017-05-04 DIAGNOSIS — Z825 Family history of asthma and other chronic lower respiratory diseases: Secondary | ICD-10-CM | POA: Insufficient documentation

## 2017-05-04 DIAGNOSIS — E669 Obesity, unspecified: Secondary | ICD-10-CM

## 2017-05-04 DIAGNOSIS — Z833 Family history of diabetes mellitus: Secondary | ICD-10-CM | POA: Diagnosis not present

## 2017-05-04 MED ORDER — NAPROXEN 500 MG PO TABS: 500.0000 mg | ORAL_TABLET | Freq: Two times a day (BID) | ORAL | 1 refills | Status: AC

## 2017-05-04 NOTE — Progress Notes (Signed)
Assessment & Plan:  Brandon Salas was seen today for abdominal pain.  Diagnoses and all orders for this visit:  Left lower quadrant pain -     naproxen (NAPROSYN) 500 MG tablet; Take 1 tablet (500 mg total) by mouth 2 (two) times daily with a meal. Labs pending May need US  Dizziness Labs pending; BMP, CBC, Hgb A1c Avoid sudden movements Eat small frequent meals Drink plenty of water 48-64oz daily  Tobacco dependence Brandon Salas was counseled on the dangers of tobacco use, and was advised to quit. Reviewed strategies to maximize success, including removing cigarettes and smoking materials from environment, stress management and support of family/friends as well as pharmacological alternatives including: Wellbutrin, Chantix, Nicotine patch, Nicotine gum or lozenges. Smoking cessation support: smoking cessation hotline: 1-800-QUIT-NOW.  Smoking cessation classes are also available through [facility]. Call [phone] or visit our website at [email or web address].   Spent 3 minutes counseling on smoking cessation and patient is not ready to quit.  Patient has been counseled on age-appropriate routine health concerns for screening and prevention. These are reviewed and up-to-date. Referrals have been placed accordingly. Immunizations are up-to-date or declined.    Subjective:   Chief Complaint  Patient presents with  . Abdominal Pain    Patient is here as a new patient. Patient stated he have a left abdominal pain. Patient stated it feels like pressure. Patient stated he have diarrhea when he eats a lot. Patient stated when he feels dizzy eatting sweets help him.    HPI Brandon Salas 47 y.o. male presents to office today to establish care as a new patient. His girlfriend is here translating for him today per his request.  Abdominal Pain He has complaints of LLQ pain along with nausea. Pain 4/10 today. Pain is constant. He has taken naproxen in the past for pain  which provided significant relief. Pain is worse after eating and goes away after a bowel movement. He denies hematochezia, melena, constipation or diarrhea.   Previous Imaging CT abd/pelvis on 06-01-2016 No acute findings in the abdomen/pelvis. Small left inguinal hernia containing only peritoneal fat.   Dizziness Sometimes feels dizzy after eating. Occurrence is 2-3 times a week. Duration: a few minutes. Relieving factors: none; goes away on its own. Also will break out into a sweat and when this happens along with the dizziness he eats chocolate and it goes away. He has a family history of diabetes. His mother and his brother.  The patient describes the symptoms as lightheadedness.  Patient denies aural pressure.  He has been treated with nothing.  Previous work up has been NONE.    Review of Systems  Constitutional: Negative.  Negative for chills, diaphoresis, fever and malaise/fatigue.  HENT: Negative.  Negative for congestion, hearing loss, sinus pain and tinnitus.   Respiratory: Negative.  Negative for cough, sputum production and shortness of breath.   Cardiovascular: Negative.  Negative for chest pain, palpitations, orthopnea and leg swelling.  Gastrointestinal: Positive for abdominal pain and nausea.       SEE HPI  Genitourinary: Negative.   Neurological: Positive for dizziness.       SEE HPI  Psychiatric/Behavioral: Negative for suicidal ideas.    History reviewed. No pertinent past medical history.  History reviewed. No pertinent surgical history.  Family History  Problem Relation Age of Onset  . Diabetes Mother   . Diabetes Brother   . Hyperlipidemia Brother   . Asthma Brother     Social History Reviewed  with no changes to be made today.   Outpatient Medications Prior to Visit  Medication Sig Dispense Refill  . naproxen (NAPROSYN) 500 MG tablet Take 1 tablet (500 mg total) by mouth 2 (two) times daily. 30 tablet 0   No facility-administered medications prior to  visit.     No Known Allergies     Objective:    BP 125/82 (BP Location: Right Arm, Patient Position: Sitting, Cuff Size: Normal)   Pulse (!) 108   Temp 98 F (36.7 C) (Oral)   Ht 5\' 3"  (1.6 m)   Wt 171 lb (77.6 kg)   SpO2 97%   BMI 30.29 kg/m  Wt Readings from Last 3 Encounters:  05/04/17 171 lb (77.6 kg)  06/01/16 165 lb (74.8 kg)  11/22/15 168 lb (76.2 kg)    Physical Exam  Constitutional: He is oriented to person, place, and time. He appears well-developed and well-nourished.  HENT:  Head: Normocephalic.  Right Ear: Hearing, tympanic membrane, external ear and ear canal normal.  Left Ear: Hearing, tympanic membrane, external ear and ear canal normal.  Nose: Nose normal.  Eyes: EOM are normal. Pupils are equal, round, and reactive to light.  Neck: Normal range of motion. No thyromegaly present.  Cardiovascular: Regular rhythm and normal heart sounds. Tachycardia present. Exam reveals no gallop and no friction rub.  No murmur heard. Pulmonary/Chest: Effort normal and breath sounds normal. No respiratory distress. He has no wheezes. He has no rales. He exhibits no tenderness.  Abdominal: Soft. Normal appearance and bowel sounds are normal. There is no hepatosplenomegaly, splenomegaly or hepatomegaly. There is tenderness in the left upper quadrant and left lower quadrant. There is no rigidity, no rebound, no guarding, no CVA tenderness, no tenderness at McBurney's point and negative Murphy's sign.  Musculoskeletal: Normal range of motion.  Neurological: He is alert and oriented to person, place, and time. He has normal strength. He displays a negative Romberg sign. Coordination and gait normal.  Skin: Skin is warm and dry. No rash noted. No erythema. No pallor.  Psychiatric: He has a normal mood and affect. His behavior is normal. Judgment and thought content normal.       Patient has been counseled extensively about nutrition and exercise as well as the importance of  adherence with medications and regular follow-up. The patient was given clear instructions to go to ER or return to medical center if symptoms don't improve, worsen or new problems develop. The patient verbalized understanding.   Follow-up: Return for fasting labs this week. Claiborne Rigg, FNP-BC [facility] and [facility] [place], [place] [phone]   05/05/2017, 10:45 PM

## 2017-05-04 NOTE — Patient Instructions (Addendum)
Mareos (Dizziness) Los mareos son un problema muy frecuente. Causan sensacin de inestabilidad o de desvanecimiento. Puede sentir que se va a desmayar. Un mareo puede provocarle una lesin si se tropieza o se cae. Cualquier persona puede marearse, pero los [place] son ms frecuentes en los [employer]. Esta afeccin puede tener muchas causas, por ejemplo:  Medicamentos.  Deshidratacin.  Enfermedad. CUIDADOS EN EL HOGAR Estas indicaciones pueden ayudarlo con el trastorno: Comida y bebida  Beba suficiente lquido para Pharmacologist el pis (orina) claro o de color amarillo plido. Esto evita la deshidratacin. Trate de beber ms lquidos transparentes, como agua.  No beba alcohol.  Limite la cantidad de cafena que bebe o come si el mdico se lo indic.  Limite la cantidad de sal que bebe o come si el mdico se lo indic. Actividad  Evite los movimientos rpidos. ? Cuando se levante de una silla, sujtese hasta sentirse bien. ? Por la maana, sintese primero a un lado de la cama. Cuando se sienta bien, pngase lentamente de [address] se sostiene de algo, hasta que sepa que ha logrado el equilibrio.  Mueva las piernas con frecuencia si debe estar de pie en un lugar durante mucho tiempo. Mientras est de pie, contraiga y relaje los msculos de las piernas.  No conduzca vehculos ni utilice maquinarias pesadas si se siente mareado.  Evite agacharse si se siente mareado. En su casa, coloque los objetos de modo que le resulte fcil alcanzarlos sin Public librarian. Estilo de vida  No consuma ningn producto que contenga tabaco, lo que incluye cigarrillos, tabaco de Theatre manager o Administrator, Civil Service. Si necesita ayuda para dejar de fumar, consulte al [employer].  Trate de reducir el nivel de estrs practicando actividades como el yoga o la meditacin. Hable con el mdico si necesita ayuda. Instrucciones generales  Controle sus mareos para ver si hay cambios.  Tome los medicamentos solamente  como se lo haya indicado el mdico. Hable con el mdico si cree que algn medicamento que est tomando es la causa de sus [place].  Infrmele a un amigo o a un familiar si se siente mareado. Pdale a esta persona que llame al mdico si observa cambios en su comportamiento.  Concurra a todas las visitas de control como se lo haya indicado el mdico. Esto es importante. SOLICITE AYUDA SI:  Los [employer].  Los [employer] o la sensacin de Production assistant, radio.  Siente malestar estomacal (nuseas).  Tiene problemas para escuchar.  Aparecen nuevos sntomas.  Cuando est de pie se siente inestable o que la habitacin da vueltas.  SOLICITE AYUDA DE INMEDIATO SI:  Vomita o tiene diarrea y no puede comer ni beber nada.  Tiene dificultad para lo siguiente: ? Hablar. ? Caminar. ? Tragar. ? Usar los brazos, las manos o las piernas.  Siente una debilidad generalizada.  No piensa con claridad o tiene dificultades para armar oraciones. Es posible que un amigo o un familiar adviertan que esto ocurre.  Tiene los siguientes sntomas: ? Journalist, newspaper. ? Dolor en el vientre (abdomen). ? Falta de aire. ? Sudoracin.  Cambios en la visin.  Hemorragias.  Dolores de [place].  Dolor o rigidez en el cuello.  Grant Ruts.  Esta informacin no tiene Theme park manager el consejo del mdico. Asegrese de hacerle al mdico cualquier pregunta que tenga. Document Released: 03/13/2011 Document Revised: 08/08/2014 Document Reviewed: 03/20/2014 Elsevier Interactive Patient Education  2017 Elsevier Inc.  Dolor abdominal en adultos Abdominal Pain, Adult El dolor abdominal puede tener muchas causas.  A menudo, no es grave y Lithuaniamejora sin tratamiento o con tratamiento en la casa. Sin embargo, a Facilities managerveces el dolor abdominal es intenso. El mdico revisar sus antecedentes mdicos y le har un examen fsico para tratar de Production assistant, radiodeterminar la causa del dolor abdominal. Siga estas instrucciones en su  casa:  Tome los medicamentos de venta libre y los recetados solamente como se lo haya indicado el mdico. No tome un laxante a menos que se lo haya indicado el mdico.  Beba suficiente lquido para Pharmacologistmantener la orina clara o de color amarillo plido.  Controle su afeccin para ver si hay cambios.  Concurra a todas las visitas de control como se lo haya indicado el mdico. Esto es importante. Comunquese con un mdico si:  El dolor abdominal cambia o empeora.  No tiene apetito o baja de peso sin proponrselo.  Est estreido o tiene diarrea durante ms de 2 o 3das.  Tiene dolor cuando orina o defeca.  El dolor abdominal lo despierta de noche.  El dolor empeora con las comidas, despus de comer o con determinados alimentos.  Tiene vmitos y no puede retener nada.  Tiene fiebre. Solicite ayuda de inmediato si:  El dolor no desaparece tan pronto como el mdico le dijo que era esperable.  No puede detener los vmitos.  El Engineer, miningdolor se siente solo en zonas del abdomen, como el lado derecho o la parte inferior izquierda del abdomen.  Las heces son sanguinolentas o de color negro, o de aspecto alquitranado.  Tiene dolor intenso, clicos, o meteorismo en el abdomen.  Tiene signos de deshidratacin, por ejemplo: ? Larose KellsOrina oscura, muy escasa o falta de [place]. ? Labios agrietados. ? [employer] Holdings[employer] seca. ? Ojos hundidos. ? Somnolencia. ? Debilidad. Esta informacin no tiene Theme park managercomo fin reemplazar el consejo del mdico. Asegrese de hacerle al mdico cualquier pregunta que tenga. Document Released: 03/24/2005 Document Revised: 03/13/2016 Document Reviewed: 09/05/2015 Elsevier Interactive Patient Education  [employer]2018 Elsevier Inc.

## 2017-05-05 ENCOUNTER — Encounter: Payer: Self-pay | Admitting: Nurse Practitioner

## 2017-05-08 ENCOUNTER — Ambulatory Visit: Payer: BLUE CROSS/BLUE SHIELD | Attending: Internal Medicine

## 2017-05-08 DIAGNOSIS — R1032 Left lower quadrant pain: Secondary | ICD-10-CM | POA: Insufficient documentation

## 2017-05-08 DIAGNOSIS — R42 Dizziness and giddiness: Secondary | ICD-10-CM | POA: Diagnosis present

## 2017-05-08 NOTE — Progress Notes (Signed)
Patient here for lab visit only 

## 2017-05-09 LAB — HEMOGLOBIN A1C
ESTIMATED AVERAGE GLUCOSE: 120 mg/dL
HEMOGLOBIN A1C: 5.8 % — AB (ref 4.8–5.6)

## 2017-05-09 LAB — BASIC METABOLIC PANEL
BUN / CREAT RATIO: 11 (ref 9–20)
BUN: 10 mg/dL (ref 6–24)
CO2: 22 mmol/L (ref 20–29)
Calcium: 9.5 mg/dL (ref 8.7–10.2)
Chloride: 102 mmol/L (ref 96–106)
Creatinine, Ser: 0.95 mg/dL (ref 0.76–1.27)
GFR, EST AFRICAN AMERICAN: 111 mL/min/{1.73_m2} (ref >59)
GFR, EST NON AFRICAN AMERICAN: 96 mL/min/{1.73_m2} (ref >59)
Glucose: 97 mg/dL (ref 65–99)
POTASSIUM: 4.5 mmol/L (ref 3.5–5.2)
SODIUM: 141 mmol/L (ref 134–144)

## 2017-05-09 LAB — LIPID PANEL
CHOL/HDL RATIO: 6.2 ratio — AB (ref 0.0–5.0)
Cholesterol, Total: 203 mg/dL — ABNORMAL HIGH (ref 100–199)
HDL: 33 mg/dL — ABNORMAL LOW (ref >39)
LDL Calculated: 125 mg/dL — ABNORMAL HIGH (ref 0–99)
Triglycerides: 226 mg/dL — ABNORMAL HIGH (ref 0–149)
VLDL Cholesterol Cal: 45 mg/dL — ABNORMAL HIGH (ref 5–40)

## 2017-05-09 LAB — CBC
HEMATOCRIT: 47.4 % (ref 37.5–51.0)
Hemoglobin: 16 g/dL (ref 13.0–17.7)
MCH: 30.6 pg (ref 26.6–33.0)
MCHC: 33.8 g/dL (ref 31.5–35.7)
MCV: 91 fL (ref 79–97)
PLATELETS: 203 10*3/uL (ref 150–379)
RBC: 5.23 x10E6/uL (ref 4.14–5.80)
RDW: 12.7 % (ref 12.3–15.4)
WBC: 6.8 10*3/uL (ref 3.4–10.8)

## 2017-05-11 ENCOUNTER — Other Ambulatory Visit: Payer: Self-pay

## 2017-05-19 ENCOUNTER — Telehealth: Payer: Self-pay | Admitting: Nurse Practitioner

## 2017-05-19 ENCOUNTER — Telehealth: Payer: Self-pay

## 2017-05-19 NOTE — Telephone Encounter (Signed)
-----   Message from Zelda W Fleming, NP sent at 05/18/2017 12:30 AM EST ----- Labs show prediabetes.  Tests show increased cholesterol/lipid levels. Patient should work on a low fat, heart healthy diet and participate in regular aerobic exercise program to control as well by working out at least 150 minutes per week. No fried foods. No junk foods, sodas, sugary drinks, unhealthy snacking, or smoking. You can also begin taking omega 3 fish oil 2 tablets daily to help lower your cholesterol levels 

## 2017-05-19 NOTE — Telephone Encounter (Signed)
CMA called patient's wife and she stated she wanted to know how many MG she need for the fish oil.

## 2017-05-19 NOTE — Telephone Encounter (Signed)
CMA called and spoke to patient's wife and inform on lab results and PCP advising.   Patient's wife's will relay the message to patient.

## 2017-05-19 NOTE — Telephone Encounter (Signed)
-----   Message from Claiborne RiggZelda W Fleming, NP sent at 05/18/2017 12:30 AM EST ----- Labs show prediabetes.  Tests show increased cholesterol/lipid levels. Patient should work on a low fat, heart healthy diet and participate in regular aerobic exercise program to control as well by working out at least 150 minutes per week. No fried foods. No junk foods, sodas, sugary drinks, unhealthy snacking, or smoking. You can also begin taking omega 3 fish oil 2 tablets daily to help lower your cholesterol levels

## 2017-05-19 NOTE — Telephone Encounter (Signed)
Pt wife called to get tha lab result as well how many MG need to buy fot the fish oil pill he need to take please follow up, Spanish

## 2017-05-20 ENCOUNTER — Encounter: Payer: Self-pay | Admitting: Nurse Practitioner

## 2017-05-20 ENCOUNTER — Ambulatory Visit: Payer: BLUE CROSS/BLUE SHIELD | Attending: Nurse Practitioner | Admitting: Nurse Practitioner

## 2017-05-20 VITALS — BP 119/76 | HR 81 | Temp 98.4°F | Ht 63.0 in | Wt 172.4 lb

## 2017-05-20 DIAGNOSIS — K4090 Unilateral inguinal hernia, without obstruction or gangrene, not specified as recurrent: Secondary | ICD-10-CM | POA: Diagnosis not present

## 2017-05-20 DIAGNOSIS — F17200 Nicotine dependence, unspecified, uncomplicated: Secondary | ICD-10-CM | POA: Diagnosis not present

## 2017-05-20 DIAGNOSIS — Z79899 Other long term (current) drug therapy: Secondary | ICD-10-CM | POA: Insufficient documentation

## 2017-05-20 DIAGNOSIS — R1012 Left upper quadrant pain: Secondary | ICD-10-CM | POA: Diagnosis not present

## 2017-05-20 DIAGNOSIS — R110 Nausea: Secondary | ICD-10-CM | POA: Diagnosis not present

## 2017-05-20 DIAGNOSIS — R1032 Left lower quadrant pain: Secondary | ICD-10-CM | POA: Diagnosis not present

## 2017-05-20 DIAGNOSIS — R7303 Prediabetes: Secondary | ICD-10-CM | POA: Diagnosis not present

## 2017-05-20 DIAGNOSIS — E119 Type 2 diabetes mellitus without complications: Secondary | ICD-10-CM | POA: Diagnosis present

## 2017-05-20 NOTE — Telephone Encounter (Signed)
CMA informed patient. Patient understood.  

## 2017-05-20 NOTE — Patient Instructions (Addendum)
Prediabetes (Prediabetes) QU ES LA PREDIABETES? La prediabetes es la enfermedad que presenta un nivel de azcar en la sangre (glucemia) ms alto de lo normal, pero no lo suficientemente alto como para que le diagnostiquen diabetes tipo2. El hecho de ser prediabtico lo pone en riesgo de desarrollar diabetes tipo2 (diabetes mellitus tipo2). La prediabetes tambin se puede llamar intolerancia a la glucosa o glucosa alterada en ayunas. Generalmente, la prediabetes no causa sntomas. El mdico puede diagnosticar esta enfermedad por los anlisis de sangre. Los anlisis para detectar la prediabetes se pueden realizar si usted tiene sobrepeso y si presenta al menos un factor de riesgo ms de prediabetes. Entre los factores de riesgo de prediabetes, se incluyen los siguientes:  Tener un familiar con diabetes tipo2.  Sobrepeso u obesidad.  Tener ms de 45 aos.  Ser descendiente de indgenas norteamericanos, afroamericanos, hispanos o latinos, o asiticos o isleos del Pacfico.  Tener un estilo de vida inactivo (sedentario).  Tener antecedentes de diabetes gestacional o sndrome de ovario poliqustico (SOP).  Tener niveles bajos del colesterol bueno (HDL-C) o niveles altos de grasas en la sangre (triglicridos).  Tener hipertensin arterial. QU ES LA GLUCEMIA Y CMO SE MIDE? La glucemia hace referencia a la cantidad de glucosa que tiene en el torrente sanguneo. La glucosa proviene de los alimentos que contienen azcar y almidn (carbohidratos) que el organismo descompone para formar glucosa. El nivel de glucemia se puede medir en mg/dl (miligramos por decilitro) o mmol/l (milimoles por litro).La glucemia puede controlarse con uno o ms de los siguientes anlisis de sangre:  Medicin de la glucemia en ayunas. No se le permitir comer (tendr que hacer ayuno) durante al menos 8horas antes de que se tome una muestra de sangre. ? Un rango normal de glucemia en ayunas es de 70 a 100mg/dl (de  3,9 a 5,6mmol/l).  Un anlisis de sangre de A1c (hemoglobina A1c). Este anlisis proporciona informacin sobre el control de la glucemia durante los ltimos 2 o 3meses.  Prueba de tolerancia a la glucosa oral (PTGO). Esta prueba mide la glucemia dos veces: ? Despus del ayuno. Este es el valor inicial. ? Dos horas despus de ingerir una bebida que contiene glucosa. Pueden diagnosticarle prediabetes en los siguientes casos:  Si la glucemia en ayunas es de 100 a 125mg/dl (de 5,6 a 6,9mmol/l).  Si el nivel de A1c es del 5,7% al 6,4%.  Si el resultado de la PTGO es de 140 a 199mg/dl (de 7,8 a 11mmol/l). Estos anlisis de sangre se pueden repetir para confirmar el diagnstico. QU SUCEDE SI LA GLUCEMIA ES DEMASIADO ALTA? El pncreas produce una hormona (insulina) que ayuda a mover la glucosa desde el torrente sanguneo hacia las clulas. Cuando las clulas no responden de forma adecuada a la insulina que el organismo produce (resistencia a la insulina), el exceso de glucosa se acumula en la sangre en vez de dirigirse hacia las clulas. Como consecuencia, se puede desarrollar glucemia alta (hiperglucemia), que puede causar muchas complicaciones. Este es uno de los sntomas de la prediabetes. QU PUEDE SUCEDER SI LA GLUCEMIA PERMANECE MS ALTA DE LO NORMAL DURANTE MUCHO TIEMPO? Es peligroso tener la glucemia alta durante mucho tiempo. Demasiada glucosa en la sangre puede daar los nervios y los vasos sanguneos. El dao a largo plazo puede provocar complicaciones de la diabetes, por ejemplo:  Cardiopata.  Ictus.  Ceguera.  Enfermedad renal.  Depresin.  Mala circulacin en los pies y en las piernas, que podra llevar a la extraccin quirrgica (amputacin) en   casos graves. CMO SE PUEDE EVITAR QUE LA PREDIABETES SE CONVIERTA EN DIABETES TIPO2? Para prevenir la diabetes tipo2, tome las siguientes medidas:  Haga actividad fsica. ? Haga actividad fsica de intensidad moderada  durante al menos como mnimo 5das por [employer], o tanto como le haya indicado el mdico. Podra hacer caminatas dinmicas, ciclismo o [place]. ? Pregntele al mdico qu actividades son seguras para usted. Una combinacin de actividades puede ser la mejor opcin, por ejemplo, caminar, practicar natacin, andar en bicicleta y hacer entrenamiento de fuerza.  Baje de General Electric se lo haya indicado el mdico. ? Bajar entre el 5% y el 7% del peso corporal puede revertir la resistencia a la insulina. ? El mdico puede determinar cuntos kilos tiene que bajar y [place] a que adelgace de Wellsite geologist segura.  Siga un plan de alimentacin saludable. Este incluye consumir protenas magras, hidratos de carbono complejos, frutas y verduras frescas, productos lcteos con bajo contenido de grasa y grasas saludables. ? Siga las indicaciones del mdico respecto de las restricciones para las comidas o las bebidas. ? Programe una cita con un especialista en alimentacin y nutricin (nutricionista certificado) para que lo ayude a Quar[place] manager plan de alimentacin saludable adecuado para usted.  No fume ni consuma ningn producto que contenga tabaco, lo que incluye cigarrillos, tabaco de Theatre manager y Administrator, Civil Service. Si necesita ayuda para dejar de fumar, consulte al [employer].  B[employer] International de venta libre y los recetados como se lo haya indicado el mdico. Es posible que le receten medicamentos que ayuden a disminuir el riesgo de tener diabetes tipo2. Esta informacin no tiene Theme park manager el consejo del mdico. Asegrese de hacerle al mdico cualquier pregunta que tenga. Document Released: 05/15/2015 Document Revised: 05/15/2015 Document Reviewed: 05/15/2015 Elsevier Interactive Patient Education  2018 [employer].  Prevencin de la diabetes mellitus tipo 2 Preventing Type 2 Diabetes Mellitus La diabetes tipo 2 (diabetes mellitus tipo 2) es una enfermedad a Equities trader plazo (crnica)  que afecta los niveles de azcar en la sangre (glucosa). Normalmente, una hormona denominada insulina permite el ingreso de la glucosa en las clulas del cuerpo. Las clulas usan la glucosa para Psychiatrist. En la diabetes tipo 2, puede presentarse uno de los [address], o ambos:  El cuerpo no produce la cantidad suficiente de insulina.  El cuerpo no responde de [place] a la insulina que produce (resistencia a la insulina).  La resistencia a la insulina o la falta de insulina hacen que el exceso de glucosa se acumule en la sangre, en lugar de ir a las clulas. Como resultado, se produce un nivel alto de glucemia (hiperglucemia), lo cual puede causar muchas complicaciones. Al tener sobreseo o ser obeso y tener un estilo de vida inactivo (sedentario) puede aumentar su riesgo de padecer diabetes. La diabetes tipo 2 se puede retrasar o prevenir al realizar determinados cambios en la alimentacin y el estilo de vida. Qu cambios se pueden Interior and spatial designer?  Consuma comidas y refrigerios saludables con regularidad. Tenga a mano un refrigerio saludable cuando sienta [employer], tal como una fruta o un puado de nueces.  Como carnes [place] y protenas con bajo contenido de grasas saturadas como pollo, pescado, claras de huevo y frijoles. Evite las carnes procesadas.  Coma muchas frutas y verduras, y muchos granos no procesados (granos enteros). Se recomienda que coma: ? 1 a 2 tazas de Corning Incorporated. ? 2 a 3 tazas de verduras todos los  das. ? 6 a 8 onzas de granos enteros [address] los das, como avena, [place]salvado, trigo bulgur, arroz integral, quinoa y mijo.  Coma productos lcteos de bajo contenido graso, como [place], yogur y Gresh[place] Parkqueso.  Coma alimentos que contengan grasas saludables, como frutos secos, aguacate, aceite de Shell[place]oliva y aceite de canola.  Beba agua durante todo Medical laboratory scientific officerel da. Evite los lquidos que contengan azcar agregada como gaseosas o t  Belved[place] Parkdulce.  Siga las indicaciones de su mdico respecto de las restricciones especficas para las comidas o las bebidas.  Controle la cantidad de alimentos que come por vez (tamao de la porcin). ? Verifique las etiquetas de los alimentos para verificar los tamaos de las porciones de los alimentos. ? Use una balanza de cocina para pesar las porciones de alimentos.  Saltee o hierva al vapor los Publishing rights manageralimentos en lugar de frerlos. Cocine con agua o caldo en lugar de aceites o manteca.  Limite su consumo de: ? Sal (sodio). No consuma ms de 1 cucharadita (2,400mg ) de sodio por da. Si tiene enfermedad cardaca o presin arterial alta, debe consumir menos de  a  cucharadita (1,500mg ) de sodio por da. ? Grasas saturadas. Se trata de grasa slida a temperatura Ashlandambiente como la manteca o la grasa de la carne. Qu cambios en el estilo de vida se pueden realizar?  Actividad  Haga actividad fsica de intensidad moderada durante al menos 30minutos como mnimo 5das por semana o con la frecuencia que le indique su mdico.  Pregntele al mdico qu actividades son seguras para usted. Una combinacin de actividades puede ser la mejor opcin, por ejemplo, caminar, practicar natacin, andar en bicicleta y hacer entrenamiento de fuerza.  Trate de agregar actividad fsica a su jornada. Por ejemplo: ? Estacione en lugares ms alejados de lo habitual para tener que caminar ms. Por ejemplo, estacione en una equina alejada del estacionamiento cuando vaya a la oficina o a la tienda de comestibles. ? Vaya a caminar durante su hora de almuerzo. ? Utilice las Microbiologistescaleras en lugar de ascensores o escaleras mecnicas. Bajar de peso  [place]Baje de peso segn se le indique. El mdico puede determinar cuntos kilos tiene que bajar y [place] Cityayudarlo a que adelgace de Wellsite geologistmanera segura.  Si tiene sobrepeso o es obeso, es posible que se le indique que pierda al menos entre el 5 y el 7% de su Runner, broadcasting/film/videopeso corporal. Alcohol y tabaco   Limite  el consumo de alcohol a no ms de 1 medida por da si es mujer y no est Orthoptistembarazada y a 2 medidas por da si es hombre. Una medida equivale a 12onzas de cerveza, 5onzas de vino o 1onzas de bebidas alcohlicas de alta graduacin.  No consuma ningn producto que contenga tabaco, lo que incluye cigarrillos, tabaco de Theatre managermascar y Administrator, Civil Servicecigarrillos electrnicos. Si necesita ayuda para dejar de fumar, consulte al mdico. Trabaje con su mdico  Contrlese la glucemia de Farmington Hillsmanera regular, segn lo indicado por su mdico.  Analice sus factores de riesgo y cmo puede reducir su riesgo de padecer diabetes.  Somtase a pruebas de Airline pilotdeteccin segn lo indicado por su mdico. Puede realizarse pruebas de deteccin con regularidad, especialmente si tiene ciertos factores de riesgo de padecer diabetes tipo 2.  Haga una cita con un especialista en dietas y nutricin (nutricionista matriculado). Un nutricionista matriculado puede ayudarlo a crear un plan de alimentacin saludable y a comprender los tamaos de las porciones y las etiquetas de los alimentos. Por qu son importantes estos cambios?  Es posible prevenir o  retrasar la diabetes tipo 2 y los problemas de salud relacionados al realizar cambios en el estilo de vida y la alimentacin.  Puede ser difcil reconocer los signos de la diabetes tipo 2. La mejor manera de evitar posibles daos en el cuerpo es tomar medidas para prevenir la enfermedad antes que usted desarrolle los sntomas. Qu puede suceder si no se realizan cambios?  Sus niveles de glucemia pueden continuar aumentando. Tener la glucemia alta durante mucho tiempo es peligroso. Demasiada glucosa en la sangre puede daar los vasos sanguneos, el corazn, los riones, los nervios y los ojos.  Puede desarrollar prediabetes o diabetes tipo 2. La diabetes tipo 2 puede ocasionar muchos problemas de salud crnicos y complicaciones, tales como: ? Enfermedad cardaca. ? Accidente  cerebrovascular. ? Ceguera. ? Enfermedad renal. ? Depresin. ? Circulacin deficiente en los pies y las piernas, lo cual podra ocasionar una extirpacin quirrgica (amputacin) en casos graves. Dnde encontrar apoyo:  Pida a su mdico que le recomiende un nutricionista matriculado, IT trainer en diabetes o un programa para Publishing copy de [place].  Busque grupos de apoyo para bajar de peso a nivel local o en lnea.  Asista a un gimnasio, club de acondicionamiento fsico o nase a un grupo que realice actividad fsica al aire libre como un club de caminata. Dnde encontrar ms informacin: Para obtener ms informacin sobre la diabetes y la prevencin de la diabetes, visite:  Asociacin Americana de la Diabetes (American Diabetes Association, ADA): www.diabetes.org  [employer]ger de la Diabetes y las Enfermedades Digestivas y [facility] of Diabetes and Digestive and Kidney Diseases): ToyArticles.ca  Para obtener ms informacin sobre Aflac Incorporated, visite:  Departamento de Agricultura de los EE.UU., Mi plato (U.S. Department of Agriculture, Medical sales representative, Choose My Plate): http://yates.biz/  Oficina para la Prevencin de Enfermedades y Promocin de Freight forwarder, Government social research officer de Paediatric nurse (Office of Disease Prevention and Health Promotion, ODPHP, Dietary Guidelines): ListingMagazine.si  Resumen  Puede reducir su riesgo de padecer diabetes tipo 2 al aumentar su actividad fsica, comer alimentos saludables y Publishing copy de peso segn se lo indiquen.  Hable con su mdico sobre su riesgo de padecer diabetes tipo 2. Pregunte Amgen Inc de sangre o pruebas de deteccin que debe International aid/development worker. Esta informacin no tiene Theme park manager el consejo del mdico. Asegrese de hacerle al mdico cualquier pregunta que tenga. Document Released: 05/15/2015 Document Revised: 07/14/2016 Document Reviewed: 05/15/2015 Elsevier  Interactive Patient Education  2018 Ar[employer]. Plan de alimentacin para la prediabetes (Prediabetes Eating Plan) La prediabetes, tambin llamada intolerancia a la glucosa o alteracin de la glucosa en ayunas, es una afeccin que eleva los niveles de azcar en la sangre (glucemia) por encima de lo normal. Seguir una dieta saludable puede ayudar a mantener la prediabetes bajo control, y tambin reduce el riesgo de tener diabetes tipo2 y cardiopata, que es ms alto en las personas que tienen esta afeccin. Junto con la actividad fsica habitual, una dieta saludable:  Promueve la prdida de [place].  Ayuda a Medical sales representative de Banker.  Ayuda a mejorar la forma en que el organismo [place] la insulina. QU DEBO SABER ACERCA DE ESTE PLAN DE ALIMENTACIN?  Use el ndice glucmico (IG) para planificar las comidas. El ndice le informa con qu rapidez un alimento elevar su nivel de azcar en la sangre. Elija los alimentos con bajo IG. Estos tardan ms tiempo en subir el nivel de azcar en la sangre.  Preste mucha atencin a la cantidad de hidratos de carbono  que hay en los alimentos que consume. Los hidratos de carbono OfficeMax Incorporated niveles de Banker.  Lleve un registro de la cantidad de caloras que ingiere. Ingerir la cantidad correcta de caloras lo ayudar a Cabin crew peso saludable. Bajar alrededor del 7por ciento del peso inicial puede ayudar a Automotive engineer la diabetes tipo2.  Tal vez deba seguir Clinical cytogeneticist. Esta incluye una gran cantidad de verduras, carnes magras o pescado, cereales integrales, frutas, as como aceites y grasas saludables.  QU ALIMENTOS PUEDO COMER? Cereales Cereales integrales, como panes, galletas, cereales y pastas de salvado o integrales. Avena sin azcar. Trigo burgol. Cebada. Quinua. Arroz integral. Tortillas o tacos de harina de maz o de salvado. Hoover Brunette Deatra James. Espinaca. Guisantes. Remolachas. Coliflor. Repollo. Brcoli.  Zanahorias. Tomates. Calabaza. Christella Noa. Hierbas. Pimientos. Cebollas. Pepinos. Repollitos de Bruselas. Frutas Frutos rojos. Bananas. Manzanas. Naranjas. Uvas. Papaya. Mango. Granada. Kiwi. Pomelo. Cerezas. Carnes y otras fuentes de protenas Mariscos. [place], entre ellas, pollo y [place] o cortes magros de carne de cerdo y de [place]. Tofu. Huevos. Los frutos secos. Frijoles. Lcteos Productos lcteos descremados o semidescremados, como yogur, queso cottage y [place]. CHS Inc. T. Caf. Gaseosas sin azcar o dietticas. Agua de [place]. Leche. Productos alternativos de la [place], [address] de soja o de [place]. Condimentos [place]. Salsa de pepinillos. [place] con bajo contenido de [place] (the territory South of 60 deg S) y de International aid/development worker. Salsa barbacoa con bajo contenido de grasa y de azcar. Mayonesa sin grasa o con bajo contenido de [place]. Dulces y postres Budines sin azcar o con bajo contenido de [place]. Helados y otros dulces congelados sin azcar o con bajo contenido de [place]. Grasas y Arts development officer. Nueces. Aceite de oliva. Los artculos mencionados arriba pueden no ser Raytheon de las bebidas o los alimentos recomendados. Comunquese con el nutricionista para conocer ms opciones. QU ALIMENTOS NO SE RECOMIENDAN? Cereales Productos a base de [place] y de [place], como panes, pastas, bocadillos y cereales. Bebidas Bebidas azucaradas, como t helado y gaseosas con International aid/development worker. Dulces y [address] de [place], [place] tortas, [place], [place]k, Programmer, systems y tarta de [place]. Los artculos mencionados arriba pueden no ser Raytheon de las bebidas y los alimentos que se Theatre stage manager. Comunquese con el nutricionista para obtener ms informacin. Esta informacin no tiene Theme park manager el consejo del mdico. Asegrese de hacerle al mdico cualquier pregunta que tenga. Document Released: 12/13/2014 Document Revised: 12/13/2014 Document Reviewed: 04/19/2014 Elsevier Interactive  Patient Education  2017 Elsevier Inc.  Prevencin de la diabetes mellitus tipo 2 Preventing Type 2 Diabetes Mellitus La diabetes tipo 2 (diabetes mellitus tipo 2) es una enfermedad a Equities trader plazo (crnica) que afecta los niveles de azcar en la sangre (glucosa). Normalmente, una hormona denominada insulina permite el ingreso de la glucosa en las clulas del cuerpo. Las clulas usan la glucosa para Psychiatrist. En la diabetes tipo 2, puede presentarse uno de los [address], o ambos:  El cuerpo no produce la cantidad suficiente de insulina.  El cuerpo no responde de [place] a la insulina que produce (resistencia a la insulina).  La resistencia a la insulina o la falta de insulina hacen que el exceso de glucosa se acumule en la sangre, en lugar de ir a las clulas. Como resultado, se produce un nivel alto de glucemia (hiperglucemia), lo cual puede causar muchas complicaciones. Al tener sobreseo o ser obeso y tener un estilo de vida inactivo (sedentario) puede aumentar su riesgo de padecer diabetes. La diabetes tipo 2  se puede retrasar o prevenir al Restaurant manager, fast food alimentacin y el estilo de vida. Qu cambios se pueden Interior and spatial designer?  Consuma comidas y refrigerios saludables con regularidad. Tenga a mano un refrigerio saludable cuando sienta [employer], tal como una fruta o un puado de nueces.  Como carnes [place] y protenas con bajo contenido de grasas saturadas como pollo, pescado, claras de huevo y frijoles. Evite las carnes procesadas.  Coma muchas frutas y verduras, y muchos granos no procesados (granos enteros). Se recomienda que coma: ? 1 a 2 tazas de Corning Incorporated. ? 2 a 3 tazas de San[employer]-SCI. ? 6 a 8 onzas de granos enteros [address], como avena, [place], trigo [place], arroz integral, quinoa y mijo.  Coma productos lcteos de bajo contenido graso, como [place], yogur y [place].  Coma alimentos  que contengan grasas saludables, como frutos secos, aguacate, aceite de [place] y aceite de canola.  Beba agua durante todo Medical laboratory scientific officer. Evite los lquidos que contengan azcar agregada como gaseosas o t [place].  Siga las indicaciones de su mdico respecto de las restricciones especficas para las comidas o las bebidas.  Controle la cantidad de alimentos que come por vez (tamao de la porcin). ? Verifique las etiquetas de los alimentos para verificar los tamaos de las porciones de los alimentos. ? Use una balanza de cocina para pesar las porciones de alimentos.  Saltee o hierva al vapor los Publishing rights manager de frerlos. Cocine con agua o caldo en lugar de aceites o manteca.  Limite su consumo de: ? Sal (sodio). No consuma ms de 1 cucharadita (2,400mg ) de Pharmacist, community. Si tiene enfermedad cardaca o presin arterial alta, debe consumir menos de  a  cucharadita (1,500mg ) de sodio por da. ? Grasas saturadas. Se trata de grasa slida a temperatura [place] o la grasa de la carne. Qu cambios en el estilo de vida se pueden realizar?  Actividad  Haga actividad fsica de intensidad moderada durante al menos como mnimo 5das por semana o con la frecuencia que le indique su mdico.  Pregntele al mdico qu actividades son seguras para usted. Una combinacin de actividades puede ser la mejor opcin, por ejemplo, caminar, practicar natacin, andar en bicicleta y hacer entrenamiento de fuerza.  Trate de agregar actividad fsica a su jornada. Por ejemplo: ? Estacione en lugares ms alejados de lo habitual para tener que caminar ms. Por ejemplo, estacione en una equina alejada del estacionamiento cuando vaya a la oficina o a la tienda de comestibles. ? Vaya a caminar durante su hora de almuerzo. ? Utilice las Microbiologist de ascensores o escaleras mecnicas. Bajar de peso  [place] de peso segn se le indique. El mdico puede determinar cuntos kilos tiene que  bajar y [place] a que adelgace de Wellsite geologist segura.  Si tiene sobrepeso o es obeso, es posible que se le indique que pierda al menos entre el 5 y el 7% de su Runner, broadcasting/film/video. Alcohol y tabaco   Limite el consumo de alcohol a no ms de 1 medida por da si es mujer y no est Orthoptist y a 2 medidas por da si es hombre. Una medida equivale a 12onzas de cerveza, 5onzas de vino o 1onzas de bebidas alcohlicas de alta graduacin.  No consuma ningn producto que contenga tabaco, lo que incluye cigarrillos, tabaco de Theatre manager y Administrator, Civil Service. Si necesita ayuda para dejar de fumar, consulte al  mdico. Trabaje con su mdico  Contrlese la glucemia de manera regular, segn lo indicado por su mdico.  Analice sus factores de riesgo y cmo puede reducir su riesgo de padecer diabetes.  Somtase a pruebas de Airline pilot segn lo indicado por su mdico. Puede realizarse pruebas de deteccin con regularidad, especialmente si tiene ciertos factores de riesgo de padecer diabetes tipo 2.  Haga una cita con un especialista en dietas y nutricin (nutricionista matriculado). Un nutricionista matriculado puede ayudarlo a crear un plan de alimentacin saludable y a comprender los tamaos de las porciones y las etiquetas de los alimentos. Por qu son importantes estos cambios?  Es posible prevenir o Cytogeneticist diabetes tipo 2 y los problemas de salud relacionados al realizar cambios en el estilo de vida y Psychologist, sport and exercise.  Puede ser difcil reconocer los signos de la diabetes tipo 2. La mejor manera de evitar posibles daos en el cuerpo es tomar medidas para prevenir la enfermedad antes que usted desarrolle los sntomas. Qu puede suceder si no se realizan cambios?  Sus niveles de glucemia pueden continuar aumentando. Tener la glucemia alta durante mucho tiempo es peligroso. Demasiada glucosa en la sangre puede daar los vasos sanguneos, el corazn, los riones, los nervios y los ojos.  Puede  desarrollar prediabetes o diabetes tipo 2. La diabetes tipo 2 puede ocasionar muchos problemas de salud crnicos y complicaciones, tales como: ? Enfermedad cardaca. ? Accidente cerebrovascular. ? Ceguera. ? Enfermedad renal. ? Depresin. ? Circulacin deficiente en los pies y las piernas, lo cual podra ocasionar una extirpacin quirrgica (amputacin) en casos graves. Dnde encontrar apoyo:  Pida a su mdico que le recomiende un nutricionista matriculado, IT trainer en diabetes o un programa para Publishing copy de [place].  Busque grupos de apoyo para bajar de peso a nivel local o en lnea.  Asista a un gimnasio, club de acondicionamiento fsico o nase a un grupo que realice actividad fsica al aire libre como un club de caminata. Dnde encontrar ms informacin: Para obtener ms informacin sobre la diabetes y la prevencin de la diabetes, visite:  Asociacin Americana de la Diabetes (American Diabetes Association, ADA): www.diabetes.org  [employer] de la Diabetes y las Enfermedades Digestivas y Renales [facility] of Diabetes and Digestive and Kidney Diseases): ToyArticles.ca  Para obtener ms informacin sobre [employer], visite:  Departamento de Agricultura de los EE.UU., Mi plato (U.S. Department of Agriculture, Medical sales representative, Choose My Plate): http://yates.biz/  Oficina para la Prevencin de Enfermedades y Promocin de Freight forwarder, Government social research officer de Paediatric nurse (Office of Disease Prevention and Health Promotion, ODPHP, Dietary Guidelines): ListingMagazine.si  Resumen  Puede reducir su riesgo de padecer diabetes tipo 2 al aumentar su actividad fsica, comer alimentos saludables y Publishing copy de peso segn se lo indiquen.  Hable con su mdico sobre su riesgo de padecer diabetes tipo 2. Pregunte [employer] de sangre o pruebas de deteccin que debe International aid/development worker. Esta informacin no tiene Theme park manager el consejo del  mdico. Asegrese de hacerle al mdico cualquier pregunta que tenga. Document Released: 05/15/2015 Document Revised: 07/14/2016 Document Reviewed: 05/15/2015 Elsevier Interactive Patient Education  2018 [employer].

## 2017-05-20 NOTE — Progress Notes (Signed)
Assessment & Plan:  Brandon Salas was seen today for diabetes.  Diagnoses and all orders for this visit:  Left lower quadrant pain -     US Abdomen Complete; Future  Prediabetes Aim for 30 minutes of exercise most days. Rethink what you drink. Water is great! Aim for 2-3 Carb Choices per meal (30-45 grams) +/- 1 either way  Aim for 0-15 Carbs per snack if hungry  Include protein in moderation with your meals and snacks  Consider reading food labels for Total Carbohydrate and Fat Grams of foods  Be mindful about how much sugar you are adding to beverages and other foods. Fruit Punch - find one with no sugar  Measure and decrease portions of carbohydrate foods  Make your plate and don't go back for seconds  Tobacco dependence Brandon Salas was counseled on the dangers of tobacco use, and was advised to quit. Reviewed strategies to maximize success, including removing cigarettes and smoking materials from environment, stress management and support of family/friends as well as pharmacological alternatives including: Wellbutrin, Chantix, Nicotine patch, Nicotine gum or lozenges. Smoking cessation support: smoking cessation hotline: 1-800-QUIT-NOW.  Smoking cessation classes are also available through [facility]. Call [phone] or visit our website at [email or web address].   Spent 3 minutes counseling on smoking cessation and patient is not ready to quit.  Patient has been counseled on age-appropriate routine health concerns for screening and prevention. These are reviewed and up-to-date. Referrals have been placed accordingly. Immunizations are up-to-date or declined.    Subjective:   Chief Complaint  Patient presents with  . Diabetes    Patient is here concern of lab result that show prediabetes. Patient sitll have his usual abdominal pain on the left side and would like to see if he need an ultrasound done.    Patient is here today for follow up regarding  recent lab testing and diagnosis of diabetes. He is still endorsing left sided abdominal pain unresponsive to OTC and now unresponsive prescription NSAIDs. He denies any symptoms of GERD.    Abdominal Pain  This is a recurrent problem. The current episode started more than 1 month ago. The onset quality is gradual. The problem occurs constantly. The problem has been unchanged. The pain is located in the LLQ and LUQ. The pain is at a severity of 4/10. The pain is mild. The quality of the pain is aching. Associated symptoms include nausea. Pertinent negatives include no constipation, diarrhea, fever, headaches, hematuria, melena, vomiting or weight loss.  He had a previous US of the abdomen which only showed a small left inguinal hernia containing peritoneal fat. He currently denies any vomiting. He does endorse nausea. His wife is requesting an [place] be performed as he continues to have pain.   Prediabetes Patient is accompanied by his wife regarding prediabetes. We discussed dietary modifications and exercise. I encouraged patient to lose at least 5-10% of weight in order to help reduce his a1c. They verbalized understanding.  Lab Results  Component Value Date   HGBA1C 5.8 (H) 05/08/2017    Review of Systems  Constitutional: Negative for chills, fever, malaise/fatigue and weight loss.  Respiratory: Negative.  Negative for cough, sputum production and shortness of breath.   Cardiovascular: Negative.  Negative for chest pain, orthopnea, claudication and leg swelling.  Gastrointestinal: Positive for abdominal pain and nausea. Negative for blood in stool, constipation, diarrhea, heartburn, melena and vomiting.  Genitourinary: Negative for flank pain and hematuria.  Neurological: Negative.  Negative for  dizziness, tingling and headaches.    No past medical history on file.  No past surgical history on file.  Family History  Problem Relation Age of Onset  . Diabetes Mother   . Diabetes Brother     . Hyperlipidemia Brother   . Asthma Brother     Social History Reviewed with no changes to be made today.   Outpatient Medications Prior to Visit  Medication Sig Dispense Refill  . omega-3 fish oil (MAXEPA) 1000 MG CAPS capsule Take 2 capsules by mouth.    . naproxen (NAPROSYN) 500 MG tablet Take 1 tablet (500 mg total) by mouth 2 (two) times daily with a meal. (Patient not taking: Reported on 05/20/2017) 60 tablet 1   No facility-administered medications prior to visit.     No Known Allergies     Objective:    BP 119/76 (BP Location: Right Arm, Patient Position: Sitting, Cuff Size: Normal)   Pulse 81   Temp 98.4 F (36.9 C) (Oral)   Ht 5\' 3"  (1.6 m)   Wt 172 lb 6.4 oz (78.2 kg)   SpO2 98%   BMI 30.54 kg/m  Wt Readings from Last 3 Encounters:  05/20/17 172 lb 6.4 oz (78.2 kg)  05/04/17 171 lb (77.6 kg)  06/01/16 165 lb (74.8 kg)    Physical Exam  Constitutional: He is oriented to person, place, and time. He appears well-developed and well-nourished. He is cooperative.  HENT:  Head: Normocephalic and atraumatic.  Eyes: EOM are normal.  Neck: Normal range of motion.  Cardiovascular: Normal rate, regular rhythm, normal heart sounds and intact distal pulses. Exam reveals no gallop and no friction rub.  No murmur heard. Pulmonary/Chest: Effort normal and breath sounds normal. No tachypnea. No respiratory distress. He has no decreased breath sounds. He has no wheezes. He has no rhonchi. He has no rales. He exhibits no tenderness.  Abdominal: Soft. Bowel sounds are normal. He exhibits no distension and no mass. There is no hepatosplenomegaly, splenomegaly or hepatomegaly. There is tenderness in the left upper quadrant and left lower quadrant. There is no rebound, no guarding and no CVA tenderness.    Endorses pain with firm pressure applied to left side of abdomen. No palpable masses.  Musculoskeletal: Normal range of motion. He exhibits no edema.  Neurological: He is alert  and oriented to person, place, and time. Coordination normal.  Skin: Skin is warm and dry.  Psychiatric: He has a normal mood and affect. His behavior is normal. Judgment and thought content normal.  Nursing note and vitals reviewed.      Patient has been counseled extensively about nutrition and exercise as well as the importance of adherence with medications and regular follow-up. The patient was given clear instructions to go to ER or return to medical center if symptoms don't improve, worsen or new problems develop. The patient verbalized understanding.   Follow-up: Return in about 3 months (around 08/17/2017) for labs only for a1c.   Claiborne RiggZelda W Fleming, FNP-BC [facility] [place], [place] [phone]   05/24/2017, 5:39 PM

## 2017-05-20 NOTE — Telephone Encounter (Signed)
noted 

## 2017-05-20 NOTE — Telephone Encounter (Signed)
She can take 2000mg 

## 2017-05-24 ENCOUNTER — Encounter: Payer: Self-pay | Admitting: Nurse Practitioner

## 2017-08-17 ENCOUNTER — Ambulatory Visit: Payer: BLUE CROSS/BLUE SHIELD | Admitting: Nurse Practitioner

## 2017-08-22 ENCOUNTER — Emergency Department (HOSPITAL_COMMUNITY): Payer: BLUE CROSS/BLUE SHIELD

## 2017-08-22 ENCOUNTER — Emergency Department (HOSPITAL_COMMUNITY)
Admission: EM | Admit: 2017-08-22 | Discharge: 2017-08-22 | Disposition: A | Discharge status: Home / Self Care | Payer: BLUE CROSS/BLUE SHIELD | Attending: Emergency Medicine | Admitting: Emergency Medicine

## 2017-08-22 ENCOUNTER — Other Ambulatory Visit: Payer: Self-pay

## 2017-08-22 ENCOUNTER — Encounter (HOSPITAL_COMMUNITY): Payer: Self-pay

## 2017-08-22 DIAGNOSIS — F17210 Nicotine dependence, cigarettes, uncomplicated: Secondary | ICD-10-CM | POA: Insufficient documentation

## 2017-08-22 DIAGNOSIS — R109 Unspecified abdominal pain: Secondary | ICD-10-CM | POA: Diagnosis present

## 2017-08-22 DIAGNOSIS — Z79899 Other long term (current) drug therapy: Secondary | ICD-10-CM | POA: Insufficient documentation

## 2017-08-22 LAB — URINALYSIS, ROUTINE W REFLEX MICROSCOPIC
BILIRUBIN URINE: NEGATIVE
Glucose, UA: NEGATIVE mg/dL
Hgb urine dipstick: NEGATIVE
KETONES UR: NEGATIVE mg/dL
LEUKOCYTES UA: NEGATIVE
Nitrite: NEGATIVE
PH: 5 (ref 5.0–8.0)
PROTEIN: NEGATIVE mg/dL
Specific Gravity, Urine: 1.016 (ref 1.005–1.030)

## 2017-08-22 LAB — CBC
HCT: 47.3 % (ref 39.0–52.0)
Hemoglobin: 16.3 g/dL (ref 13.0–17.0)
MCH: 29.7 pg (ref 26.0–34.0)
MCHC: 34.5 g/dL (ref 30.0–36.0)
MCV: 86.2 fL (ref 78.0–100.0)
PLATELETS: 215 10*3/uL (ref 150–400)
RBC: 5.49 MIL/uL (ref 4.22–5.81)
RDW: 12.6 % (ref 11.5–15.5)
WBC: 7.2 10*3/uL (ref 4.0–10.5)

## 2017-08-22 LAB — BASIC METABOLIC PANEL
Anion gap: 9 (ref 5–15)
BUN: 10 mg/dL (ref 6–20)
CALCIUM: 9.2 mg/dL (ref 8.9–10.3)
CHLORIDE: 107 mmol/L (ref 101–111)
CO2: 23 mmol/L (ref 22–32)
CREATININE: 1.07 mg/dL (ref 0.61–1.24)
GFR calc non Af Amer: >60 mL/min (ref >60)
Glucose, Bld: 163 mg/dL — ABNORMAL HIGH (ref 65–99)
Potassium: 3.5 mmol/L (ref 3.5–5.1)
SODIUM: 139 mmol/L (ref 135–145)

## 2017-08-22 LAB — HEPATIC FUNCTION PANEL
ALBUMIN: 4.1 g/dL (ref 3.5–5.0)
ALT: 27 U/L (ref 17–63)
AST: 22 U/L (ref 15–41)
Alkaline Phosphatase: 69 U/L (ref 38–126)
Bilirubin, Direct: <0.1 mg/dL — ABNORMAL LOW (ref 0.1–0.5)
TOTAL PROTEIN: 7.1 g/dL (ref 6.5–8.1)
Total Bilirubin: 0.6 mg/dL (ref 0.3–1.2)

## 2017-08-22 LAB — I-STAT TROPONIN, ED: TROPONIN I, POC: 0.01 ng/mL (ref 0.00–0.08)

## 2017-08-22 LAB — LIPASE, BLOOD: LIPASE: 34 U/L (ref 11–51)

## 2017-08-22 MED ORDER — SODIUM CHLORIDE 0.9 % IV BOLUS
1000.0000 mL | Freq: Once | INTRAVENOUS | Status: AC
  Administered 2017-08-22: 1000 mL via INTRAVENOUS

## 2017-08-22 MED ORDER — OXYCODONE-ACETAMINOPHEN 5-325 MG PO TABS
1.0000 | ORAL_TABLET | Freq: Once | ORAL | Status: AC
Start: 2017-08-22 — End: 2017-08-22
  Administered 2017-08-22: 1 via ORAL
  Filled 2017-08-22: qty 1

## 2017-08-22 MED ORDER — IOPAMIDOL (ISOVUE-300) INJECTION 61%
INTRAVENOUS | Status: AC
  Filled 2017-08-22: qty 30

## 2017-08-22 NOTE — ED Provider Notes (Signed)
[facility] EMERGENCY DEPARTMENT Provider Note   CSN: [id number] Arrival date & time: 08/22/17  1121     History   Chief Complaint Chief Complaint  Patient presents with  . Abdominal Pain  . Shortness of Breath    HPI Brandon Salas is a 47 y.o. male.  HPI Patient is a 47 year old male with no pertinent past history comes today complaining of right flank pain as well as abdominal distention and constipation.  Patient denies any fevers chills endorses 2 episodes of emesis nonbilious nonbloody as well as watery diarrhea for the past 2 days.  Patient denies any melanotic or blood in the stool.  Denies chest pain or shortness of breath.  Patient denies abdominal pain.   History reviewed. No pertinent past medical history.  Patient Active Problem List   Diagnosis Date Noted  . Dizziness 05/04/2017  . Abdominal pain 05/10/2015    History reviewed. No pertinent surgical history.      Home Medications    Prior to Admission medications   Medication Sig Start Date End Date Taking? Authorizing Provider  omega-3 fish oil (MAXEPA) 1000 MG CAPS capsule Take 2 capsules by mouth.   Yes [provider]  naproxen (NAPROSYN) 500 MG tablet Take 1 tablet (500 mg total) by mouth 2 (two) times daily with a meal. Patient not taking: Reported on 05/20/2017 05/04/17   Claiborne Rigg, NP    Family History Family History  Problem Relation Age of Onset  . Diabetes Mother   . Diabetes Brother   . Hyperlipidemia Brother   . Asthma Brother     Social History Social History   Tobacco Use  . Smoking status: Current Every Day Smoker    Packs/day: 1.00  . Smokeless tobacco: Never Used  Substance Use Topics  . Alcohol use: No    Frequency: Never    Comment: occ  . Drug use: No     Allergies   Patient has no known allergies.   Review of Systems Review of Systems  Constitutional: Negative for chills and fever.  Respiratory: Positive for cough.  Negative for shortness of breath.   Cardiovascular: Negative for chest pain.  Gastrointestinal: Positive for abdominal distention, constipation, diarrhea, nausea and vomiting. Negative for abdominal pain and blood in stool.  All other systems reviewed and are negative.    Physical Exam Updated Vital Signs BP 134/87   Pulse 81   Temp 98.3 F (36.8 C) (Oral)   Resp 19   Ht  (1.6 m)   Wt 74.8 kg (165 lb)   SpO2 96%   BMI 29.23 kg/m   Physical Exam  Constitutional: He appears well-developed and well-nourished.  HENT:  Head: Normocephalic and atraumatic.  Eyes: Conjunctivae are normal.  Neck: Neck supple.  Cardiovascular: Normal rate and regular rhythm.  No murmur heard. Pulmonary/Chest: Effort normal and breath sounds normal. No respiratory distress.  Abdominal: Soft. There is no tenderness. There is no CVA tenderness, no tenderness at McBurney's point and negative Murphy's sign.  Musculoskeletal: He exhibits no edema.  Neurological: He is alert.  Skin: Skin is warm and dry.  Psychiatric: He has a normal mood and affect.  Nursing note and vitals reviewed.    ED Treatments / Results  Labs (all labs ordered are listed, but only abnormal results are displayed) Labs Reviewed  BASIC METABOLIC PANEL - Abnormal; Notable for the following components:      Result Value   Glucose, Bld 163 (*)  All other components within normal limits  HEPATIC FUNCTION PANEL - Abnormal; Notable for the following components:   Bilirubin, Direct <0.1 (*)    All other components within normal limits  CBC  LIPASE, BLOOD  URINALYSIS, ROUTINE W REFLEX MICROSCOPIC  I-STAT TROPONIN, ED    EKG None  Radiology Ct Abdomen Pelvis Wo Contrast  Result Date: 08/22/2017 CLINICAL DATA:  Acute onset of right-sided abdominal pain, shortness of breath, nausea and vomiting. EXAM: CT ABDOMEN AND PELVIS WITHOUT CONTRAST TECHNIQUE: Multidetector CT imaging of the abdomen and pelvis was performed  following the standard protocol without IV contrast. COMPARISON:  CT of the abdomen and pelvis performed 06/01/2016 FINDINGS: Lower chest: Mild bibasilar atelectasis is noted. The visualized portions of the mediastinum are unremarkable. Hepatobiliary: The liver is unremarkable in appearance. The gallbladder is unremarkable in appearance. The common bile duct remains normal in caliber. Pancreas: The pancreas is within normal limits. Spleen: The spleen is unremarkable in appearance. Adrenals/Urinary Tract: The adrenal glands are unremarkable in appearance. Nonspecific perinephric stranding is noted bilaterally. There is no evidence of hydronephrosis. No renal or ureteral stones are identified. Stomach/Bowel: The stomach is unremarkable in appearance. The small bowel is within normal limits. The appendix is normal in caliber, without evidence of appendicitis. The colon is unremarkable in appearance. Vascular/Lymphatic: The abdominal aorta is unremarkable in appearance. The inferior vena cava is grossly unremarkable. No retroperitoneal lymphadenopathy is seen. No pelvic sidewall lymphadenopathy is identified. Reproductive: The bladder is moderately distended and grossly unremarkable. The prostate remains borderline normal in size. Other: No additional soft tissue abnormalities are seen. Musculoskeletal: No acute osseous abnormalities are identified. The visualized musculature is unremarkable in appearance. IMPRESSION: 1. No acute abnormality seen within the abdomen or pelvis. 2. Mild bibasilar atelectasis noted. Electronically Signed   By: Roanna Raider M.D.   On: 08/22/2017 22:05   Dg Chest 2 View  Result Date: 08/22/2017 CLINICAL DATA:  47 year old male with a history of right upper quadrant pain and shortness of breath EXAM: CHEST - 2 VIEW COMPARISON:  None. FINDINGS: The heart size and mediastinal contours are within normal limits. Both lungs are clear. The visualized skeletal structures are unremarkable.  IMPRESSION: Negative for acute cardiopulmonary disease Electronically Signed   By: Gilmer Mor D.O.   On: 08/22/2017 12:30    Procedures Procedures (including critical care time)  Medications Ordered in ED Medications  oxyCODONE-acetaminophen (PERCOCET/ROXICET) 5-325 MG per tablet 1 tablet (1 tablet Oral Given 08/22/17 1716)  sodium chloride 0.9 % bolus 1,000 mL (0 mLs Intravenous Stopped 08/22/17 2052)     Initial Impression / Assessment and Plan / ED Course  I have reviewed the triage vital signs and the nursing notes.  Pertinent labs & imaging results that were available during my care of the patient were reviewed by me and considered in my medical decision making (see chart for details).     On exam, patient with right lower back pain.  Patient without significant CVA tenderness.  Patient abdomen is distended but soft and without tenderness palpation.  Patient's laboratory work-up largely within normal limits without concerning findings.  Patient urinalysis within normal limits.  CT abdomen pelvis collected to evaluate for possible renal stone versus other etiology, however no abnormality seen to explain patient's pain.  Patient started to follow-up with his primary care provider for further evaluation.  Doubt appendicitis given no right lower quadrant tenderness, absence of fever, HPI. Pt given appropriate f/u and return precautions. Pt voiced understanding and is agreeable  to discharge at this time.    Final Clinical Impressions(s) / ED Diagnoses   Final diagnoses:  Flank pain    ED Discharge Orders    None       Caren Griffins, MD 08/23/17 1610    Blane Ohara, MD 08/24/17 [phone]

## 2017-08-22 NOTE — ED Notes (Signed)
Results reviewed.  No changes in acuity at this time 

## 2017-08-22 NOTE — ED Triage Notes (Signed)
Pt c/o NV and right sided abd pain since yesterday with SOB.

## 2017-09-11 ENCOUNTER — Ambulatory Visit: Payer: BLUE CROSS/BLUE SHIELD | Attending: Nurse Practitioner | Admitting: Nurse Practitioner

## 2017-09-11 ENCOUNTER — Encounter: Payer: Self-pay | Admitting: Nurse Practitioner

## 2017-09-11 VITALS — BP 116/76 | HR 106 | Temp 98.2°F | Resp 18 | Ht 63.0 in | Wt 170.0 lb

## 2017-09-11 DIAGNOSIS — R109 Unspecified abdominal pain: Secondary | ICD-10-CM | POA: Diagnosis not present

## 2017-09-11 DIAGNOSIS — Z79899 Other long term (current) drug therapy: Secondary | ICD-10-CM | POA: Diagnosis not present

## 2017-09-11 DIAGNOSIS — Z791 Long term (current) use of non-steroidal anti-inflammatories (NSAID): Secondary | ICD-10-CM | POA: Diagnosis not present

## 2017-09-11 DIAGNOSIS — Z8249 Family history of ischemic heart disease and other diseases of the circulatory system: Secondary | ICD-10-CM | POA: Insufficient documentation

## 2017-09-11 DIAGNOSIS — Z833 Family history of diabetes mellitus: Secondary | ICD-10-CM | POA: Diagnosis not present

## 2017-09-11 DIAGNOSIS — M549 Dorsalgia, unspecified: Secondary | ICD-10-CM | POA: Insufficient documentation

## 2017-09-11 DIAGNOSIS — Z09 Encounter for follow-up examination after completed treatment for conditions other than malignant neoplasm: Secondary | ICD-10-CM | POA: Diagnosis present

## 2017-09-11 DIAGNOSIS — Z825 Family history of asthma and other chronic lower respiratory diseases: Secondary | ICD-10-CM | POA: Diagnosis not present

## 2017-09-11 MED ORDER — DICLOFENAC SODIUM 1 % TD GEL: 4.0000 g | Freq: Four times a day (QID) | TRANSDERMAL | 1 refills | Status: AC

## 2017-09-11 MED ORDER — TIZANIDINE HCL 4 MG PO TABS: 4.0000 mg | ORAL_TABLET | Freq: Four times a day (QID) | ORAL | 0 refills | Status: AC | PRN

## 2017-09-11 NOTE — Patient Instructions (Signed)
Dolor muscular en los adultos (Muscle Pain, Adult) El dolor muscular (mialgia) puede ser leve o intenso. La mayora de las veces, el dolor dura solo un corto perodo y desaparece sin tratamiento. Es normal sentir algo de Marketing executive despus de comenzar un programa de entrenamiento. Generalmente duelen aquellos msculos que no se utilizan con frecuencia. Puede haber muchas otras causas del dolor muscular, que incluyen las siguientes:  Uso excesivo del msculo o distensin muscular, en especial si la persona no est en buen estado fsico. Esta es la causa ms comn del dolor muscular.  Lesiones.  [place]tones.  Virus, como el de la gripe.  Enfermedades infecciosas.  Una afeccin crnica que causa dolor de [place], fatiga y dolor muscular con la palpacin (fibromialgia).  Una afeccin, como el lupus, en la que el sistema del cuerpo encargado de combatir las enfermedades ataca a otros rganos (enfermedades autoinmunes o Development worker, community).  Determinados medicamentos, como los inhibidores de la ECA y las estatinas. Para diagnosticar la causa del Marketing executive, su Special educational needs teacher un examen fsico y le har preguntas sobre el dolor y cundo comenz. Si el dolor muscular no comenz hace mucho tiempo, el mdico puede esperar antes de hacer diversas pruebas. Si el dolor muscular comenz hace mucho tiempo, el mdico puede decidir que es ms conveniente hacer pruebas de inmediato. En algunos casos, se pueden realizar pruebas para descartar ciertas afecciones o enfermedades. El tratamiento del dolor muscular depende de la causa. El cuidado en el hogar a menudo es suficiente para Stage manager. El mdico tambin puede recetarle un medicamento antiinflamatorio. INSTRUCCIONES PARA EL CUIDADO EN EL HOGAR Actividad  Si el uso excesivo del msculo est provocando dolor muscular: ? [place] sus actividades hasta que el dolor desaparezca. ? Si usted no hace actividad fsica con frecuencia, los  ejercicios deben ser suaves y regulares. ? Realice precalentamiento antes de la actividad fsica. Elongue antes y despus de hacer [place]. Esto puede ayudar a International aid/development worker de que sufra dolor muscular.  No siga haciendo actividad fsica si el dolor es muy intenso. Este tipo de dolor podra indicar que se ha lesionado un msculo. Control del dolor y de las molestias  Si se lo indican, aplique hielo sobre el msculo dolorido: ? Field seismologist hielo en una bolsa plstica. ? Coloque una [employer] piel y la bolsa de hielo. ? Coloque el hielo durante 41minutos, 2 a 3veces por da.  Tambin puede alternar [employer] aplicar hielo y Presenter, broadcasting como se lo haya indicado el mdico. Para aplicar calor, use la fuente de calor que el mdico le recomiende, como una compresa de calor hmedo o una almohadilla trmica. ? Coloque una [employer] piel y la fuente de Freight forwarder. ? Aplique el calor durante 20 a 11minutos. ? Retire la fuente de calor si la piel se le pone de color rojo brillante. Esto es muy importante si no puede sentir el dolor, el calor ni el fro. Puede correr un riesgo mayor de sufrir quemaduras. Medicamentos  [employer] de venta libre y los recetados solamente como se lo haya indicado el mdico.  No conduzca ni use maquinaria pesada mientras toma analgsicos recetados. SOLICITE ATENCIN MDICA SI:  El Marketing executive empeora y los medicamentos no surten [place].  Tiene dolor muscular que dura ms de 3das.  Tiene una erupcin cutnea o fiebre junto con el dolor muscular.  Tiene dolor muscular despus de una picadura de garrapata.  Tiene dolor muscular mientras hace actividad fsica,  aunque est en buen estado fsico.  Tiene enrojecimiento, sensibilidad o hinchazn junto con el dolor muscular.  Tiene dolor muscular despus de comenzar un medicamento nuevo o de cambiar la dosis de un medicamento. SOLICITE ATENCIN MDICA DE INMEDIATO SI:  Tiene dificultad para  respirar.  Presenta dificultad para tragar.  Tiene dolor muscular junto con rigidez en el cuello, fiebre y vmitos.  Tiene debilidad muscular intensa o no puede mover una parte del cuerpo. Esta informacin no tiene Theme park managercomo fin reemplazar el consejo del mdico. Asegrese de hacerle al mdico cualquier pregunta que tenga. Document Released: 07/01/2007 Document Revised: 07/16/2015 Document Reviewed: 08/14/2015 Elsevier Interactive Patient Education  2018 [employer].  Distensin muscular. (Muscle Strain) Una distensin muscular (estiramiento muscular) ocurre cuando un msculo se estira ms all de la longitud normal. Reece Agarcurre cuando una fuerza violenta bruscamente estira demasiado el msculo. Generalmente se desgarran algunas de las fibras del msculo. La distensin muscular es comn en los atletas. La recuperacin normalmente tarda de 1 a 2semanas. La curacin completa tarda de 5 a 6semanas. CUIDADOS EN EL HOGAR  Siga el mtodo PRICE (por sus siglas en ingls) de tratamiento para que la lesin mejore. Hgalo durante los 2 a 3 primeros das despus de la lesin: ? Licensed conveyancerroteccin. Proteja el msculo para evitar que se vuelva a lesionar. ? Reposo. Limite la actividad y descanse la parte del cuerpo lesionada. ? Hielo. Ponga el hielo en una bolsa plstica. Coloque una toalla entre la piel y la bolsa de hielo. Luego aplique el hielo y djelo actuar de 15 a 20minutos por hora. Despus del Press photographertercer da, cambie a compresas de calor hmedo. ? Compresin. Use una frula o venda elstica en la zona lesionada para brindar alivio. No la ajuste demasiado. ? Elevacin. Eleve la zona lesionada por encima del nivel del corazn.  Solo tome los medicamentos que le haya indicado su mdico.  Realice un calentamiento antes de hacer ejercicio para prevenir distensiones musculares futuras.  SOLICITE AYUDA SI:  Siente ms dolor o inflamacin (hinchazn) en la zona lesionada.  Siente adormecimiento, hormigueo o nota una  prdida de fuerza en la zona lesionada.  ASEGRESE DE QUE:  Comprende estas instrucciones.  Controlar su afeccin.  Recibir ayuda de inmediato si no mejora o si empeora.  Esta informacin no tiene Theme park managercomo fin reemplazar el consejo del mdico. Asegrese de hacerle al mdico cualquier pregunta que tenga. Document Released: 06/20/2008 Document Revised: 01/12/2013 Document Reviewed: 10/21/2012 Elsevier Interactive Patient Education  2017 [employer].

## 2017-09-11 NOTE — Progress Notes (Signed)
Assessment & Plan:  Kyran was seen today for follow-up.  Diagnoses and all orders for this visit:  Right flank pain -     diclofenac sodium (VOLTAREN) 1 % GEL; Apply 4 g topically 4 (four) times daily. -     tiZANidine (ZANAFLEX) 4 MG tablet; Take 1 tablet (4 mg total) by mouth every 6 (six) hours as needed for muscle spasms. Work on losing weight to help reduce back pain. May alternate with heat and ice application for pain relief. May also alternate with acetaminophen and Ibuprofen as prescribed for back pain. Other alternatives include massage, acupuncture and water aerobics.  You must stay active and avoid a sedentary lifestyle.  Patient has been counseled on age-appropriate routine health concerns for screening and prevention. These are reviewed and up-to-date. Referrals have been placed accordingly. Immunizations are up-to-date or declined.    Subjective:   Chief Complaint  Patient presents with  . Follow-up   HPI Brandon Salas 47 y.o. male presents to office today for follow up to right flank pain.  His significant other is here interpreting for him today.  Right Flank Pain Patient was seen in the emergency room on 08/22/2017 right flank pain, abdominal distention and constipation.  Lab work was within normal limits and CT was normal as well.  He was discharged home in stable condition and instructed to follow-up with PCP. Today he continues to endorse mild right flank pain.  Aggravating factors: Coughing or stretching.  Relieving factors: When there is no coughing or stretching.  He denies any GU symptoms such as hematuria, dysuria, or pelvic pressure.  Review of Systems  Constitutional: Negative for fever, malaise/fatigue and weight loss.  HENT: Negative.  Negative for nosebleeds.   Eyes: Negative.  Negative for blurred vision, double vision and photophobia.  Respiratory: Negative.  Negative for cough and shortness of breath.   Cardiovascular: Negative.  Negative  for chest pain, palpitations and leg swelling.  Gastrointestinal: Negative.  Negative for heartburn, nausea and vomiting.  Genitourinary: Positive for flank pain.  Musculoskeletal: Negative for myalgias.  Neurological: Negative.  Negative for dizziness, focal weakness, seizures and headaches.  Psychiatric/Behavioral: Negative.  Negative for suicidal ideas.    History reviewed. No pertinent past medical history.  History reviewed. No pertinent surgical history.  Family History  Problem Relation Age of Onset  . Diabetes Mother   . Diabetes Brother   . Hyperlipidemia Brother   . Asthma Brother     Social History Reviewed with no changes to be made today.   Outpatient Medications Prior to Visit  Medication Sig Dispense Refill  . naproxen (NAPROSYN) 500 MG tablet Take 1 tablet (500 mg total) by mouth 2 (two) times daily with a meal. 60 tablet 1  . omega-3 fish oil (MAXEPA) 1000 MG CAPS capsule Take 2 capsules by mouth.     No facility-administered medications prior to visit.     No Known Allergies     Objective:    BP 116/76 (BP Location: Left Arm, Patient Position: Sitting, Cuff Size: Normal)   Pulse (!) 106   Temp 98.2 F (36.8 C) (Oral)   Resp 18   Ht 5\' 3"  (1.6 m)   Wt 170 lb (77.1 kg)   SpO2 95%   BMI 30.11 kg/m  Wt Readings from Last 3 Encounters:  09/11/17 170 lb (77.1 kg)  08/22/17 165 lb (74.8 kg)  05/20/17 172 lb 6.4 oz (78.2 kg)    Physical Exam  Constitutional: He is oriented  to person, place, and time. He appears well-developed and well-nourished. He is cooperative.  HENT:  Head: Normocephalic and atraumatic.  Eyes: EOM are normal.  Neck: Normal range of motion.  Cardiovascular: Normal rate, regular rhythm, normal heart sounds and intact distal pulses. Exam reveals no gallop and no friction rub.  No murmur heard. Pulmonary/Chest: Effort normal and breath sounds normal. No tachypnea. No respiratory distress. He has no decreased breath sounds. He has  no wheezes. He has no rhonchi. He has no rales. He exhibits no tenderness.  Abdominal: Bowel sounds are normal.  Musculoskeletal: Normal range of motion. He exhibits tenderness. He exhibits no edema.       Right shoulder: He exhibits no tenderness and no bony tenderness.       Lumbar back: He exhibits tenderness.       Back:  Neurological: He is alert and oriented to person, place, and time. Coordination normal.  Skin: Skin is warm and dry.  Psychiatric: He has a normal mood and affect. His behavior is normal. Judgment and thought content normal.  Nursing note and vitals reviewed.      Patient has been counseled extensively about nutrition and exercise as well as the importance of adherence with medications and regular follow-up. The patient was given clear instructions to go to ER or return to medical center if symptoms don't improve, worsen or new problems develop. The patient verbalized understanding.   Follow-up: Return in about 6 weeks (around 10/23/2017) for right flank pain.   Claiborne RiggZelda W Fleming, FNP-BC [facility] [place], [place] [phone]   09/11/2017, 4:42 PM

## 2017-10-23 ENCOUNTER — Ambulatory Visit: Payer: BLUE CROSS/BLUE SHIELD | Admitting: Nurse Practitioner

## 2017-12-15 ENCOUNTER — Ambulatory Visit: Payer: Self-pay | Admitting: Nurse Practitioner

## 2020-04-03 IMAGING — CT CT ABD-PELV W/O CM
2 of 4 series · 16 of 46 positions shown, 18 images · non-contrast
Comparison: CT of the abdomen and pelvis performed 06/01/2016

CLINICAL DATA: Acute onset of right-sided abdominal pain, shortness
of breath, nausea and vomiting.

EXAM:
CT ABDOMEN AND PELVIS WITHOUT CONTRAST
TECHNIQUE: Multidetector CT imaging of the abdomen and pelvis was performed
following the standard protocol without IV contrast.

[Series 3: abd/ pelvis 5.0 i30f 2 · axial · 0.87mm/px · z∈[+855,+1320]mm · 13 of 103 slices shown, 15 images]
[im 5/103  soft-tissue]
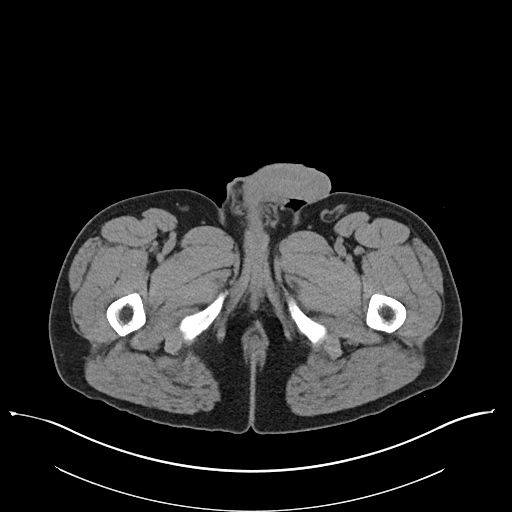
[im 5/103  bone]
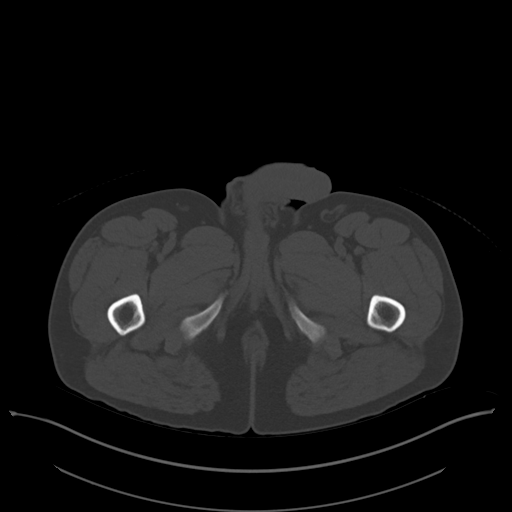
[im 14/103  soft-tissue]
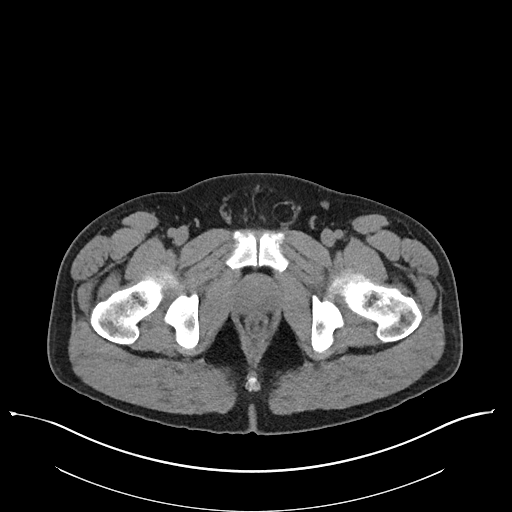
[im 23/103  soft-tissue]
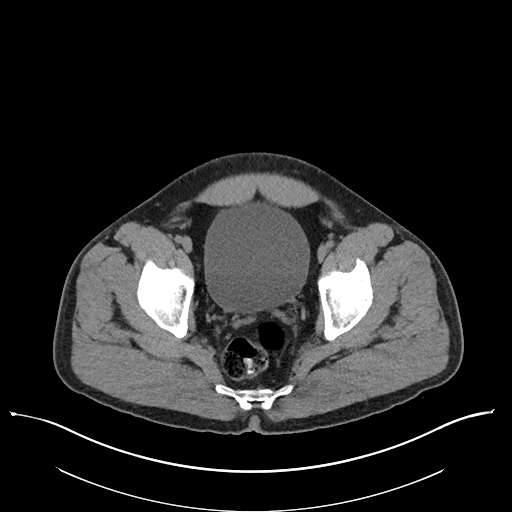
[im 27/103  soft-tissue]
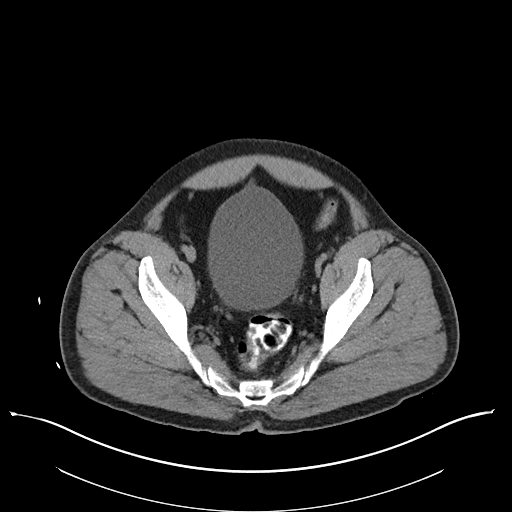
[im 36/103  soft-tissue]
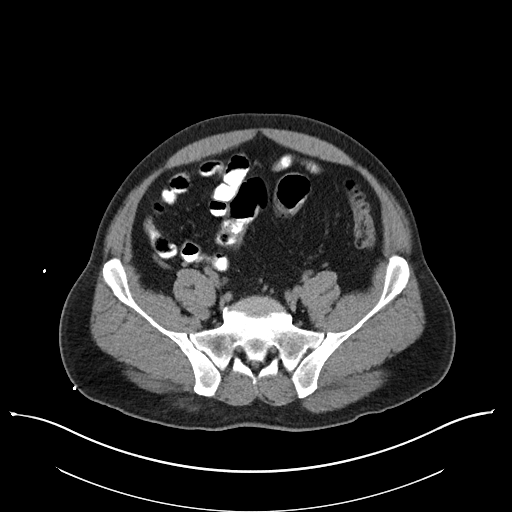
[im 45/103  soft-tissue]
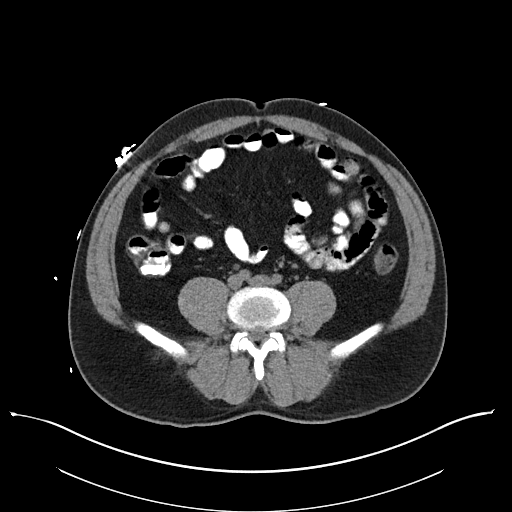
[im 54/103  soft-tissue]
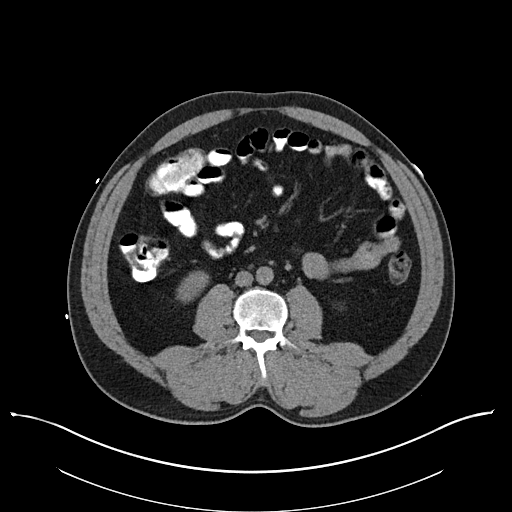
[im 58/103  soft-tissue]
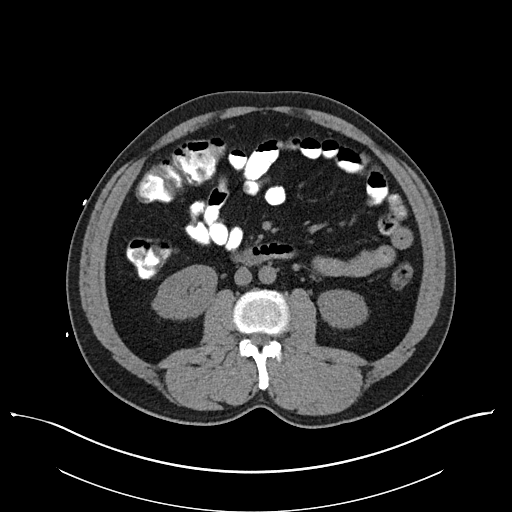
[im 67/103  soft-tissue]
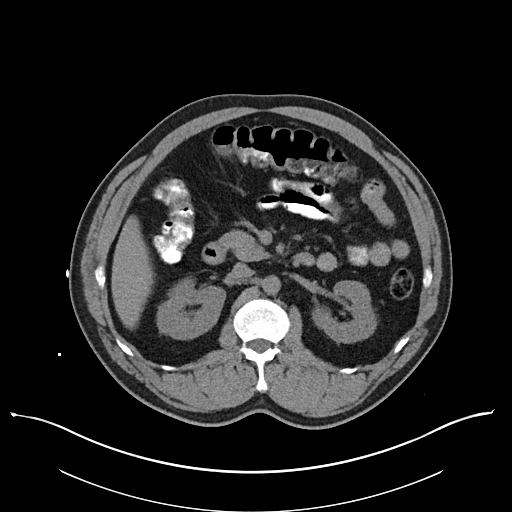
[im 67/103  bone]
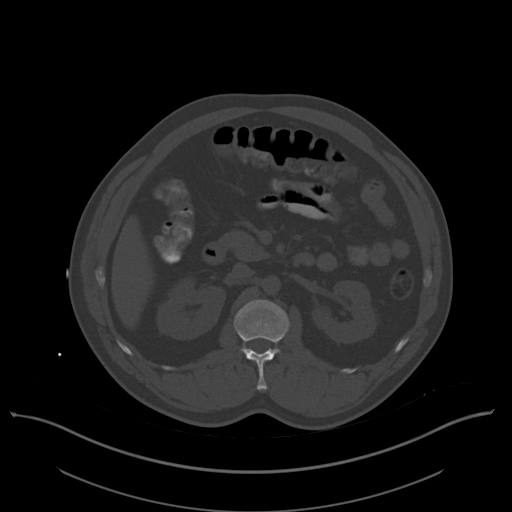
[im 76/103  soft-tissue]
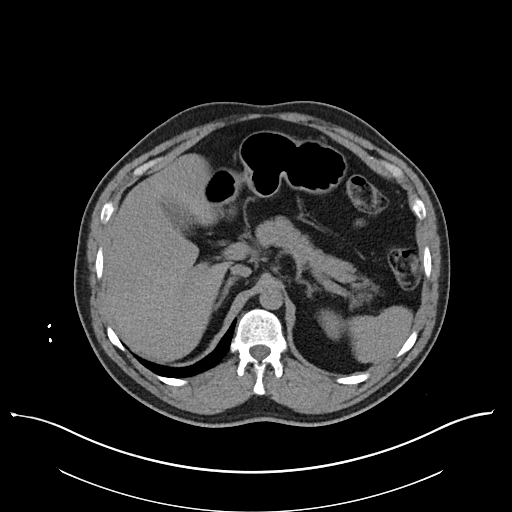
[im 80/103  soft-tissue]
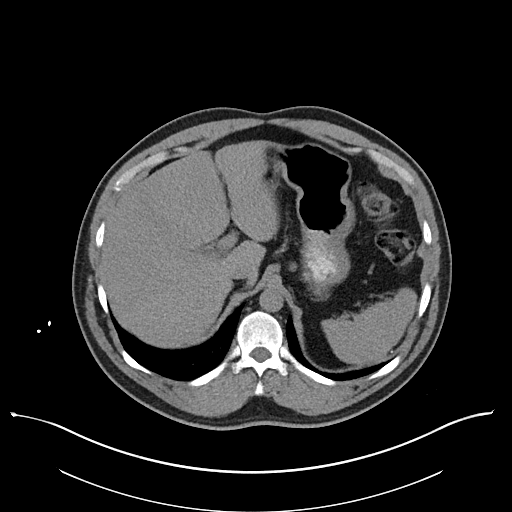
[im 89/103  soft-tissue]
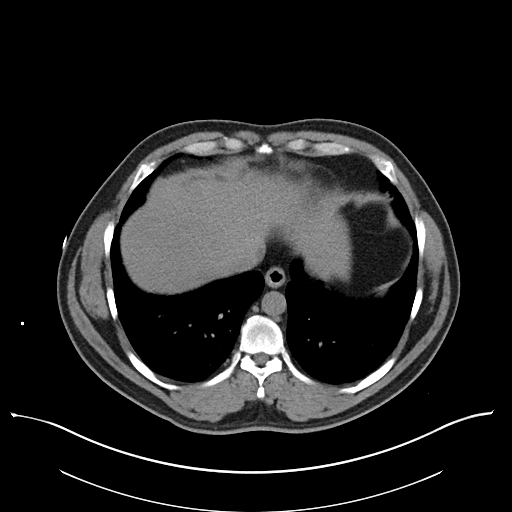
[im 98/103  soft-tissue]
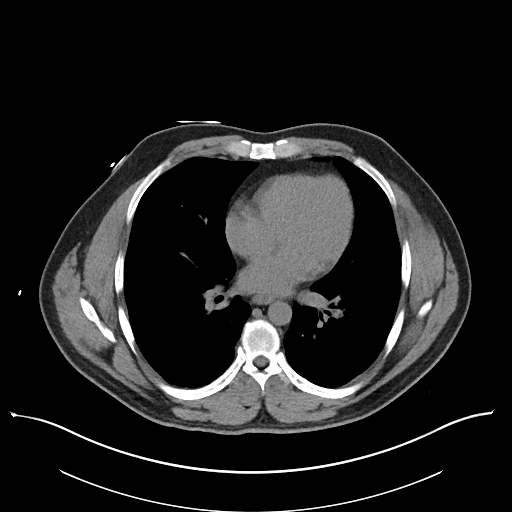

[Series 6: cor st · coronal · 0.89mm/px · 3 of 117 slices shown]
[im 39/117  soft-tissue]
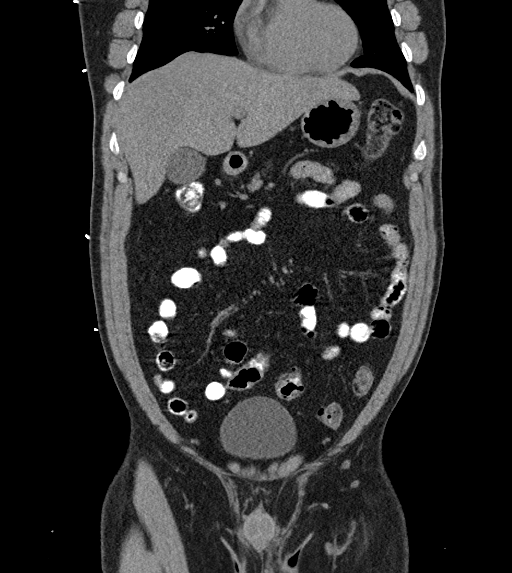
[im 52/117  soft-tissue]
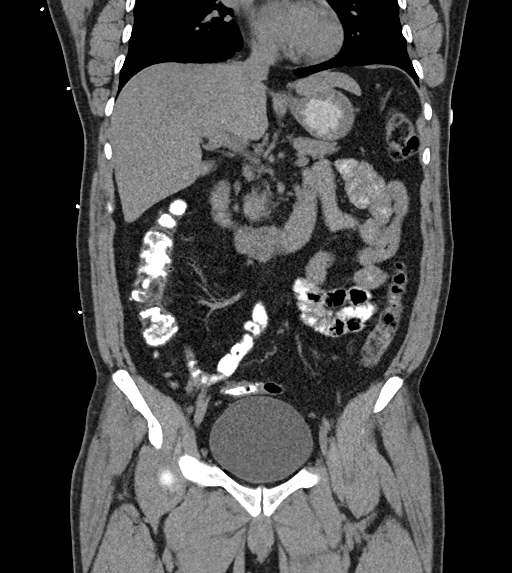
[im 65/117  soft-tissue]
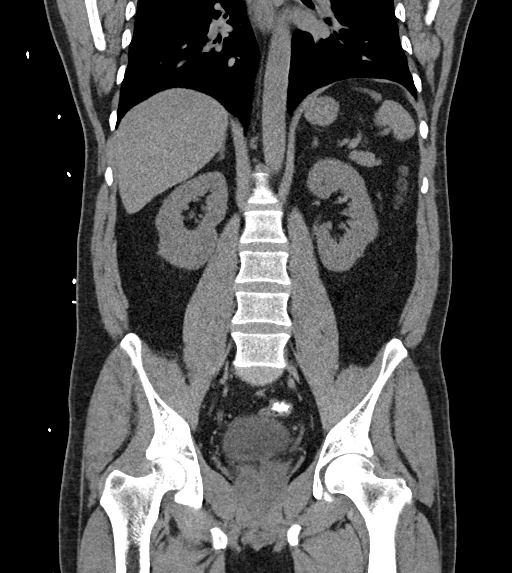

[16 of 46 positions shown; findings below may reference images not displayed]

FINDINGS: Lower chest: Mild bibasilar atelectasis is noted. The visualized
portions of the mediastinum are unremarkable.

Hepatobiliary: The liver is unremarkable in appearance. The
gallbladder is unremarkable in appearance. The common bile duct
remains normal in caliber.

Pancreas: The pancreas is within normal limits.

Spleen: The spleen is unremarkable in appearance.

Adrenals/Urinary Tract: The adrenal glands are unremarkable in
appearance.

Nonspecific perinephric stranding is noted bilaterally. There is no
evidence of hydronephrosis. No renal or ureteral stones are
identified.

Stomach/Bowel: The stomach is unremarkable in appearance. The small
bowel is within normal limits. The appendix is normal in caliber,
without evidence of appendicitis. The colon is unremarkable in
appearance.

Vascular/Lymphatic: The abdominal aorta is unremarkable in
appearance. The inferior vena cava is grossly unremarkable. No
retroperitoneal lymphadenopathy is seen. No pelvic sidewall
lymphadenopathy is identified.

Reproductive: The bladder is moderately distended and grossly
unremarkable. The prostate remains borderline normal in size.

Other: No additional soft tissue abnormalities are seen.

Musculoskeletal: No acute osseous abnormalities are identified. The
visualized musculature is unremarkable in appearance.
IMPRESSION: 1. No acute abnormality seen within the abdomen or pelvis.
2. Mild bibasilar atelectasis noted.
